# Patient Record
Sex: Female | Born: 1985 | Race: White | Hispanic: No | Smoking: Current every day smoker
Health system: Southern US, Academic
[De-identification: ages and names within clinical notes are randomized; demographics above are authoritative.]

## PROBLEM LIST (undated history)

## (undated) DIAGNOSIS — F329 Major depressive disorder, single episode, unspecified: Secondary | ICD-10-CM

## (undated) DIAGNOSIS — F419 Anxiety disorder, unspecified: Secondary | ICD-10-CM

## (undated) DIAGNOSIS — F1910 Other psychoactive substance abuse, uncomplicated: Secondary | ICD-10-CM

## (undated) DIAGNOSIS — M545 Low back pain, unspecified: Secondary | ICD-10-CM

## (undated) DIAGNOSIS — F609 Personality disorder, unspecified: Secondary | ICD-10-CM

## (undated) DIAGNOSIS — B1920 Unspecified viral hepatitis C without hepatic coma: Secondary | ICD-10-CM

## (undated) DIAGNOSIS — F32A Depression, unspecified: Secondary | ICD-10-CM

## (undated) DIAGNOSIS — G8929 Other chronic pain: Secondary | ICD-10-CM

## (undated) DIAGNOSIS — M1990 Unspecified osteoarthritis, unspecified site: Secondary | ICD-10-CM

## (undated) DIAGNOSIS — F319 Bipolar disorder, unspecified: Secondary | ICD-10-CM

## (undated) DIAGNOSIS — F909 Attention-deficit hyperactivity disorder, unspecified type: Secondary | ICD-10-CM

## (undated) DIAGNOSIS — F952 Tourette's disorder: Secondary | ICD-10-CM

## (undated) DIAGNOSIS — G40909 Epilepsy, unspecified, not intractable, without status epilepticus: Secondary | ICD-10-CM

## (undated) HISTORY — PX: TONSILLECTOMY AND ADENOIDECTOMY: SUR1326

## (undated) HISTORY — DX: Epilepsy, unspecified, not intractable, without status epilepticus (CMS HCC): G40.909

## (undated) HISTORY — DX: Tourette's disorder: F95.2

---

## 2007-08-22 ENCOUNTER — Other Ambulatory Visit (HOSPITAL_COMMUNITY): Payer: Self-pay | Admitting: Emergency Medicine

## 2017-05-24 ENCOUNTER — Emergency Department (HOSPITAL_COMMUNITY)
Admission: EM | Admit: 2017-05-24 | Discharge: 2017-05-25 | Disposition: A | Discharge status: Home / Self Care | Payer: Self-pay | Attending: Emergency Medicine | Admitting: Emergency Medicine

## 2017-05-24 ENCOUNTER — Encounter (HOSPITAL_COMMUNITY): Payer: Self-pay | Admitting: Emergency Medicine

## 2017-05-24 DIAGNOSIS — F17200 Nicotine dependence, unspecified, uncomplicated: Secondary | ICD-10-CM | POA: Insufficient documentation

## 2017-05-24 DIAGNOSIS — Z91040 Latex allergy status: Secondary | ICD-10-CM | POA: Insufficient documentation

## 2017-05-24 DIAGNOSIS — L03115 Cellulitis of right lower limb: Secondary | ICD-10-CM | POA: Insufficient documentation

## 2017-05-24 DIAGNOSIS — R609 Edema, unspecified: Secondary | ICD-10-CM

## 2017-05-24 HISTORY — DX: Unspecified viral hepatitis C without hepatic coma: B19.20

## 2017-05-24 LAB — URINALYSIS, MICROSCOPIC (REFLEX)

## 2017-05-24 LAB — URINALYSIS, ROUTINE W REFLEX MICROSCOPIC
Glucose, UA: NEGATIVE mg/dL
Hgb urine dipstick: NEGATIVE
Ketones, ur: 15 mg/dL — AB
Nitrite: NEGATIVE
Protein, ur: NEGATIVE mg/dL
Specific Gravity, Urine: 1.02 (ref 1.005–1.030)
pH: 6 (ref 5.0–8.0)

## 2017-05-24 LAB — CBC WITH DIFFERENTIAL/PLATELET
Basophils Absolute: 0 10*3/uL (ref 0.0–0.1)
Basophils Relative: 0 %
Eosinophils Absolute: 0.2 10*3/uL (ref 0.0–0.7)
Eosinophils Relative: 2 %
HCT: 36.9 % (ref 36.0–46.0)
Hemoglobin: 11.7 g/dL — ABNORMAL LOW (ref 12.0–15.0)
Lymphocytes Relative: 17 %
Lymphs Abs: 1.9 10*3/uL (ref 0.7–4.0)
MCH: 27.8 pg (ref 26.0–34.0)
MCHC: 31.7 g/dL (ref 30.0–36.0)
MCV: 87.6 fL (ref 78.0–100.0)
Monocytes Absolute: 1.1 10*3/uL — ABNORMAL HIGH (ref 0.1–1.0)
Monocytes Relative: 10 %
Neutro Abs: 7.9 10*3/uL — ABNORMAL HIGH (ref 1.7–7.7)
Neutrophils Relative %: 71 %
Platelets: 218 10*3/uL (ref 150–400)
RBC: 4.21 MIL/uL (ref 3.87–5.11)
RDW: 14.9 % (ref 11.5–15.5)
WBC: 11 10*3/uL — ABNORMAL HIGH (ref 4.0–10.5)

## 2017-05-24 LAB — COMPREHENSIVE METABOLIC PANEL
ALT: 15 U/L (ref 14–54)
ANION GAP: 10 (ref 5–15)
AST: 16 U/L (ref 15–41)
Albumin: 3.9 g/dL (ref 3.5–5.0)
Alkaline Phosphatase: 66 U/L (ref 38–126)
BUN: 13 mg/dL (ref 6–20)
CHLORIDE: 101 mmol/L (ref 101–111)
CO2: 24 mmol/L (ref 22–32)
Calcium: 8.8 mg/dL — ABNORMAL LOW (ref 8.9–10.3)
Creatinine, Ser: 0.9 mg/dL (ref 0.44–1.00)
GFR calc Af Amer: >60 mL/min (ref >60)
GFR calc non Af Amer: >60 mL/min (ref >60)
GLUCOSE: 124 mg/dL — AB (ref 65–99)
POTASSIUM: 3.1 mmol/L — AB (ref 3.5–5.1)
Sodium: 135 mmol/L (ref 135–145)
Total Bilirubin: 0.5 mg/dL (ref 0.3–1.2)
Total Protein: 7.1 g/dL (ref 6.5–8.1)

## 2017-05-24 LAB — POC URINE PREG, ED: Preg Test, Ur: NEGATIVE

## 2017-05-24 NOTE — ED Triage Notes (Addendum)
Pt to ED with c/o bil lower leg swelling and pain.  Feet and lower legs red.  Pt was brought to ED from [facility].    St's they will come back and pick her up with discharged.  [phone]

## 2017-05-25 ENCOUNTER — Inpatient Hospital Stay (HOSPITAL_COMMUNITY)
Admission: EM | Admit: 2017-05-25 | Discharge: 2017-05-27 | DRG: 603 | Disposition: A | Discharge status: Home / Self Care | Payer: Self-pay | Attending: Internal Medicine | Admitting: Internal Medicine

## 2017-05-25 ENCOUNTER — Encounter (HOSPITAL_COMMUNITY): Payer: Self-pay | Admitting: Emergency Medicine

## 2017-05-25 ENCOUNTER — Emergency Department (HOSPITAL_COMMUNITY): Payer: Self-pay

## 2017-05-25 DIAGNOSIS — L03116 Cellulitis of left lower limb: Secondary | ICD-10-CM | POA: Diagnosis present

## 2017-05-25 DIAGNOSIS — Z885 Allergy status to narcotic agent status: Secondary | ICD-10-CM

## 2017-05-25 DIAGNOSIS — L03115 Cellulitis of right lower limb: Principal | ICD-10-CM | POA: Diagnosis present

## 2017-05-25 DIAGNOSIS — Z880 Allergy status to penicillin: Secondary | ICD-10-CM

## 2017-05-25 DIAGNOSIS — B1920 Unspecified viral hepatitis C without hepatic coma: Secondary | ICD-10-CM | POA: Diagnosis present

## 2017-05-25 DIAGNOSIS — F1910 Other psychoactive substance abuse, uncomplicated: Secondary | ICD-10-CM | POA: Diagnosis present

## 2017-05-25 DIAGNOSIS — F17200 Nicotine dependence, unspecified, uncomplicated: Secondary | ICD-10-CM | POA: Diagnosis present

## 2017-05-25 DIAGNOSIS — L0390 Cellulitis, unspecified: Secondary | ICD-10-CM | POA: Diagnosis present

## 2017-05-25 DIAGNOSIS — Z91040 Latex allergy status: Secondary | ICD-10-CM

## 2017-05-25 HISTORY — DX: Low back pain, unspecified: M54.50

## 2017-05-25 HISTORY — DX: Bipolar disorder, unspecified: F31.9

## 2017-05-25 HISTORY — DX: Depression, unspecified: F32.A

## 2017-05-25 HISTORY — DX: Attention-deficit hyperactivity disorder, unspecified type: F90.9

## 2017-05-25 HISTORY — DX: Other chronic pain: G89.29

## 2017-05-25 HISTORY — DX: Anxiety disorder, unspecified: F41.9

## 2017-05-25 HISTORY — DX: Unspecified osteoarthritis, unspecified site: M19.90

## 2017-05-25 HISTORY — DX: Other psychoactive substance abuse, uncomplicated: F19.10

## 2017-05-25 HISTORY — DX: Personality disorder, unspecified: F60.9

## 2017-05-25 HISTORY — DX: Low back pain: M54.5

## 2017-05-25 HISTORY — DX: Major depressive disorder, single episode, unspecified: F32.9

## 2017-05-25 LAB — COMPREHENSIVE METABOLIC PANEL
ALBUMIN: 3.6 g/dL (ref 3.5–5.0)
ALK PHOS: 75 U/L (ref 38–126)
ALT: 16 U/L (ref 14–54)
ANION GAP: 7 (ref 5–15)
AST: 18 U/L (ref 15–41)
BILIRUBIN TOTAL: 0.3 mg/dL (ref 0.3–1.2)
BUN: 16 mg/dL (ref 6–20)
CALCIUM: 9 mg/dL (ref 8.9–10.3)
CO2: 25 mmol/L (ref 22–32)
Chloride: 108 mmol/L (ref 101–111)
Creatinine, Ser: 0.8 mg/dL (ref 0.44–1.00)
GFR calc Af Amer: >60 mL/min (ref >60)
GFR calc non Af Amer: >60 mL/min (ref >60)
GLUCOSE: 96 mg/dL (ref 65–99)
POTASSIUM: 3.7 mmol/L (ref 3.5–5.1)
Sodium: 140 mmol/L (ref 135–145)
TOTAL PROTEIN: 6.2 g/dL — AB (ref 6.5–8.1)

## 2017-05-25 LAB — I-STAT BETA HCG BLOOD, ED (MC, WL, AP ONLY)

## 2017-05-25 LAB — CBC WITH DIFFERENTIAL/PLATELET
BASOS PCT: 0 %
Basophils Absolute: 0 10*3/uL (ref 0.0–0.1)
EOS ABS: 0.1 10*3/uL (ref 0.0–0.7)
Eosinophils Relative: 2 %
HEMATOCRIT: 31.8 % — AB (ref 36.0–46.0)
HEMOGLOBIN: 10.2 g/dL — AB (ref 12.0–15.0)
LYMPHS ABS: 1.3 10*3/uL (ref 0.7–4.0)
Lymphocytes Relative: 18 %
MCH: 28.1 pg (ref 26.0–34.0)
MCHC: 32.1 g/dL (ref 30.0–36.0)
MCV: 87.6 fL (ref 78.0–100.0)
MONO ABS: 0.6 10*3/uL (ref 0.1–1.0)
MONOS PCT: 8 %
NEUTROS ABS: 5.4 10*3/uL (ref 1.7–7.7)
Neutrophils Relative %: 72 %
Platelets: 203 10*3/uL (ref 150–400)
RBC: 3.63 MIL/uL — ABNORMAL LOW (ref 3.87–5.11)
RDW: 15 % (ref 11.5–15.5)
WBC: 7.4 10*3/uL (ref 4.0–10.5)

## 2017-05-25 LAB — RAPID HIV SCREEN (HIV 1/2 AB+AG)
HIV 1/2 Antibodies: NONREACTIVE
HIV-1 P24 ANTIGEN - HIV24: NONREACTIVE

## 2017-05-25 LAB — I-STAT CG4 LACTIC ACID, ED: LACTIC ACID, VENOUS: 0.88 mmol/L (ref 0.5–1.9)

## 2017-05-25 MED ORDER — DEXTROSE 5 % IV SOLN
2.0000 g | Freq: Once | INTRAVENOUS | Status: DC
  Filled 2017-05-25: qty 2

## 2017-05-25 MED ORDER — AZTREONAM 2 G IJ SOLR
2.0000 g | Freq: Three times a day (TID) | INTRAMUSCULAR | Status: DC
  Filled 2017-05-25: qty 2

## 2017-05-25 MED ORDER — DOXYCYCLINE HYCLATE 100 MG PO CAPS: 100.0000 mg | ORAL_CAPSULE | Freq: Two times a day (BID) | ORAL | 0 refills | Status: DC

## 2017-05-25 MED ORDER — IBUPROFEN 800 MG PO TABS
800.0000 mg | ORAL_TABLET | Freq: Once | ORAL | Status: AC
  Administered 2017-05-25: 800 mg via ORAL
  Filled 2017-05-25: qty 1

## 2017-05-25 MED ORDER — METRONIDAZOLE IN NACL 5-0.79 MG/ML-% IV SOLN: 500.0000 mg | Freq: Three times a day (TID) | INTRAVENOUS | Status: DC

## 2017-05-25 MED ORDER — VANCOMYCIN HCL IN DEXTROSE 1-5 GM/200ML-% IV SOLN
1000.0000 mg | Freq: Once | INTRAVENOUS | Status: AC
  Administered 2017-05-25: 1000 mg via INTRAVENOUS
  Filled 2017-05-25: qty 200

## 2017-05-25 MED ORDER — VANCOMYCIN HCL IN DEXTROSE 750-5 MG/150ML-% IV SOLN
750.0000 mg | Freq: Two times a day (BID) | INTRAVENOUS | Status: DC
  Administered 2017-05-26 – 2017-05-27 (×3): 750 mg via INTRAVENOUS
  Filled 2017-05-25 (×4): qty 150

## 2017-05-25 MED ORDER — DOXYCYCLINE HYCLATE 100 MG PO TABS
100.0000 mg | ORAL_TABLET | Freq: Once | ORAL | Status: AC
  Administered 2017-05-25: 100 mg via ORAL
  Filled 2017-05-25: qty 1

## 2017-05-25 MED ORDER — METRONIDAZOLE IN NACL 5-0.79 MG/ML-% IV SOLN
500.0000 mg | Freq: Once | INTRAVENOUS | Status: DC
  Filled 2017-05-25: qty 100

## 2017-05-25 NOTE — ED Notes (Signed)
Pt called with no answer

## 2017-05-25 NOTE — ED Triage Notes (Signed)
Pt with complaints of bilateral lower leg swelling and pain that extends to the knees. Bilateral legs are red and swollen. Pt reports toenails have fallen off due to being so swollen.

## 2017-05-25 NOTE — ED Notes (Signed)
Patient sleeping with no distress/respirations unlabored .  

## 2017-05-25 NOTE — ED Provider Notes (Signed)
[facility] DEPT Provider Note   CSN: [id number] Arrival date & time: 05/25/17  1732     History   Chief Complaint Chief Complaint  Patient presents with  . Foot Pain    HPI Shannon Munoz is a 31 y.o. female.  31 y.o      31 year old female history of hep C and polysubstance abuse presents today complaining of bilateral foot pain, swelling, redness. She endorses fever and chills. She endorses some chest pain and dyspnea. She states that she has been in jail until 5 days ago. She denies any substance abuse in the prior 90 days. Review of records from here and [facility] revealed that she was evaluated yesterday at [facility]. She had Dopplers of bilateral lower extremities done that showed no evidence of DVT. She was positive foramphetamines, benzodiazepines, cocaine, and cannabinoids.  Past Medical History:  Diagnosis Date  . Hepatitis C     There are no active problems to display for this patient.   Past Surgical History:  Procedure Laterality Date  . TONSILLECTOMY      OB History    No data available       Home Medications    Prior to Admission medications   Medication Sig Start Date End Date Taking? Authorizing Provider  doxycycline (VIBRAMYCIN) 100 MG capsule Take 1 capsule (100 mg total) by mouth 2 (two) times daily. One po bid x 7 days 05/25/17   Zadie Rhine, MD    Family History No family history on file.  Social History Social History  Substance Use Topics  . Smoking status: Current Every Day Smoker  . Smokeless tobacco: Never Used  . Alcohol use No     Allergies   Darvon [propoxyphene]; Penicillins; and Latex   Review of Systems Review of Systems  Constitutional: Positive for chills, fatigue and fever.  Cardiovascular: Positive for chest pain.  Skin: Positive for color change, rash and wound.  All other systems reviewed and are negative.    Physical Exam Updated Vital Signs BP 112/72 (BP Location: Right Arm)   Pulse 87   Temp 97.6 F  (36.4 C) (Oral)   Resp 18   Ht 1.651 m ( )   Wt 59 kg (130 lb)   SpO2 100%   BMI 21.63 kg/m   Physical Exam  Constitutional: She appears well-developed and well-nourished.  HENT:  Head: Normocephalic and atraumatic.  Right Ear: External ear normal.  Eyes: Pupils are equal, round, and reactive to light.  Neck: Normal range of motion. Neck supple.  Cardiovascular: Normal rate and regular rhythm.   Pulmonary/Chest: Effort normal and breath sounds normal.  Abdominal: Soft. Bowel sounds are normal.  Musculoskeletal: She exhibits edema.  bilateralpedal edema with erythema creeping up right leg. Bilateral great toenails have discoloration below them. Dorsal pedalis pulses are intact  Neurological: She is alert.  Skin: Skin is warm. Capillary refill takes less than 2 seconds.  Psychiatric: Her mood appears anxious. Her affect is angry, labile and inappropriate. Her speech is rapid and/or pressured. She is agitated. Cognition and memory are normal.  Nursing note and vitals reviewed.    ED Treatments / Results  Labs (all labs ordered are listed, but only abnormal results are displayed) Labs Reviewed  CULTURE, BLOOD (ROUTINE X 2)  CULTURE, BLOOD (ROUTINE X 2)  CBC WITH DIFFERENTIAL/PLATELET  COMPREHENSIVE METABOLIC PANEL  I-STAT CG4 LACTIC ACID, ED    EKG  EKG Interpretation  Date/Time:  Wednesday May 25 2017 22:54:30 EDT Ventricular Rate:  78  PR Interval:  132 QRS Duration: 70 QT Interval:  386 QTC Calculation: 440 R Axis:   83 Text Interpretation:  Normal sinus rhythm Normal ECG Confirmed by Margarita Grizzle [phone]) on 05/25/2017 11:01:30 PM       Radiology Dg Chest 2 View  Result Date: 05/25/2017 CLINICAL DATA:  31 year old female with a history of drug abuse. EXAM: CHEST  2 VIEW COMPARISON:  None. FINDINGS: The heart size and mediastinal contours are within normal limits. Both lungs are clear. The visualized skeletal structures are unremarkable. IMPRESSION: No  active cardiopulmonary disease. Electronically Signed   By: Gilmer Mor D.O.   On: 05/25/2017 21:23    Procedures Procedures (including critical care time)  Medications Ordered in ED Medications - No data to display   Initial Impression / Assessment and Plan / ED Course  I have reviewed the triage vital signs and the nursing notes.  Pertinent labs & imaging results that were available during my care of the patient were reviewed by me and considered in my medical decision making (see chart for details).     Patient with bilateral lower extremity swelling redness and subungal hematoma/hemmorhage.  Record reviews and pe c.w. Substance abuse and ivda.  Concern for endocarditis vs cellulitis- patient given iv abx pending work up with cardiac imaging.  Discussed with Dr. Onalee Hua and she will admit Final Clinical Impressions(s) / ED Diagnoses   Final diagnoses:  Hepatitis C virus infection without hepatic coma, unspecified chronicity    New Prescriptions New Prescriptions   No medications on file     Margarita Grizzle, MD 05/26/17 1052

## 2017-05-25 NOTE — ED Notes (Signed)
Staff at [facility] advised nurse that they will pick up the patient at 8 am this morning .

## 2017-05-25 NOTE — ED Notes (Addendum)
Message left at answering machine of [facility] [phone] pt.'s discharge .

## 2017-05-25 NOTE — ED Provider Notes (Signed)
[facility] Provider Note   CSN: [id number] Arrival date & time: 05/24/17  2012     History   Chief Complaint Chief Complaint  Patient presents with  . Leg Swelling    HPI Shannon Munoz is a 31 y.o. female.  The history is provided by the patient.  Patient reports onset of bilateral LE swelling/redness over past day No fever/vomiting No cp/sob Her course is worsening Nothing improves her symptoms She has never had this before She reports h/o hepatitis C She only takes klonopin She recently moved her for "rehab" at [facility] She had been in jail prior to moving here She reports she used to use meth, and did use IV drugs, but denies injecting in legs/feet No h/o VTE She reports walking bare foot recently   Past Medical History:  Diagnosis Date  . Hepatitis C     There are no active problems to display for this patient.   Past Surgical History:  Procedure Laterality Date  . TONSILLECTOMY      OB History    No data available       Home Medications    Prior to Admission medications   Not on File    Family History No family history on file.  Social History Social History  Substance Use Topics  . Smoking status: Current Every Day Smoker  . Smokeless tobacco: Never Used  . Alcohol use No     Allergies   Darvon [propoxyphene]; Penicillins; and Latex   Review of Systems Review of Systems  Constitutional: Negative for fever.  Respiratory: Negative for shortness of breath.   Cardiovascular: Negative for chest pain.  Gastrointestinal: Negative for vomiting.  Musculoskeletal: Positive for joint swelling.  Skin: Positive for color change and wound.  All other systems reviewed and are negative.    Physical Exam Updated Vital Signs BP (!) 92/58 (BP Location: Right Arm)   Pulse 91   Temp 98.3 F (36.8 C) (Oral)   Resp 16   Ht 1.651 m ( )   Wt 59 kg (130 lb)   SpO2 100%   BMI 21.63 kg/m   Physical Exam CONSTITUTIONAL:  sleeping on arrival but easily arousable, no distress.  disheveled HEAD: Normocephalic/atraumatic EYES: EOMI/PERRL ENMT: Mucous membranes moist NECK: supple no meningeal signs SPINE/BACK:entire spine nontender CV: S1/S2 noted, no murmurs/rubs/gallops noted LUNGS: Lungs are clear to auscultation bilaterally, no apparent distress ABDOMEN: soft, nontender  NEURO: Pt is awake/alert/appropriate, moves all extremitiesx4.  No facial droop.   EXTREMITIES: pulses normal/equal, full ROM, see photos No crepitus.  No puncture wounds.  She can range both ankles.  No calf tenderness.   SKIN: warm, color normal PSYCH: no abnormalities of mood noted, alert and oriented to situation   Patient gave verbal permission to utilize photo for medical documentation only The image was not stored on any personal device   ED Treatments / Results  Labs (all labs ordered are listed, but only abnormal results are displayed) Labs Reviewed  CBC WITH DIFFERENTIAL/PLATELET - Abnormal; Notable for the following:       Result Value   WBC 11.0 (*)    Hemoglobin 11.7 (*)    Neutro Abs 7.9 (*)    Monocytes Absolute 1.1 (*)    All other components within normal limits  COMPREHENSIVE METABOLIC PANEL - Abnormal; Notable for the following:    Potassium 3.1 (*)    Glucose, Bld 124 (*)    Calcium 8.8 (*)    All other components within  normal limits  URINALYSIS, ROUTINE W REFLEX MICROSCOPIC - Abnormal; Notable for the following:    Bilirubin Urine SMALL (*)    Ketones, ur 15 (*)    Leukocytes, UA SMALL (*)    All other components within normal limits  URINALYSIS, MICROSCOPIC (REFLEX) - Abnormal; Notable for the following:    Bacteria, UA RARE (*)    Squamous Epithelial / LPF 0-5 (*)    All other components within normal limits  POC URINE PREG, ED    EKG  EKG Interpretation None       Radiology No results found.  Procedures Procedures (including critical care time)  Medications Ordered in  ED Medications  doxycycline (VIBRA-TABS) tablet 100 mg (100 mg Oral Given 05/25/17 0132)     Initial Impression / Assessment and Plan / ED Course  I have reviewed the triage vital signs and the nursing notes.  Pertinent labs  results that were available during my care of the patient were reviewed by me and considered in my medical decision making (see chart for details).     Pt stable Not septic appearing No distress Suspect this may be cellulitis superimposed on peripheral edema as she has been walking barefoot, just got out of jail Doubt DVT No signs of CHF No signs of septic joint No abscess noted Will d/c home on doxycycline Advised rest and elevation of legs   Final Clinical Impressions(s) / ED Diagnoses   Final diagnoses:  Peripheral edema  Cellulitis of right lower extremity    New Prescriptions New Prescriptions   DOXYCYCLINE (VIBRAMYCIN) 100 MG CAPSULE    Take 1 capsule (100 mg total) by mouth 2 (two) times daily. One po bid x 7 days     Zadie Rhine, MD 05/25/17 [phone]

## 2017-05-25 NOTE — Progress Notes (Signed)
Pharmacy Antibiotic Note  Marijane Trower is a 31 y.o. female admitted on 05/25/2017 with cellulitis.  Pharmacy has been consulted for vancomycin dosing.  Plan: Vancomycin 1000 IV once then   every 12 hours.  Goal trough 15-20 mcg/mL.  Height:  (165.1 cm) Weight: 130 lb (59 kg) IBW/kg (Calculated) : 57  Temp (24hrs), Avg:98 F (36.7 C), Min:97.6 F (36.4 C), Max:98.3 F (36.8 C)   Recent Labs Lab 05/24/17 2041 05/25/17 2138 05/25/17 2155  WBC 11.0* 7.4  --   CREATININE 0.90 0.80  --   LATICACIDVEN  --   --  0.88    Estimated Creatinine Clearance: 92.5 mL/min (by C-G formula based on SCr of 0.8 mg/dL).    Allergies  Allergen Reactions  . Darvon [Propoxyphene] Anaphylaxis  . Penicillins Anaphylaxis  . Latex Rash    Thank you for allowing pharmacy to be a part of this patient's care.  Toniann Fail Jian Hodgman 05/25/2017 11:01 PM

## 2017-05-25 NOTE — ED Notes (Signed)
Pt discharged, waiting for ride.  Pt asleep, resp e/u,  NAD noted at this time.

## 2017-05-26 ENCOUNTER — Encounter (HOSPITAL_COMMUNITY): Payer: Self-pay | Admitting: General Practice

## 2017-05-26 DIAGNOSIS — F1910 Other psychoactive substance abuse, uncomplicated: Secondary | ICD-10-CM

## 2017-05-26 DIAGNOSIS — L03119 Cellulitis of unspecified part of limb: Secondary | ICD-10-CM

## 2017-05-26 DIAGNOSIS — B1920 Unspecified viral hepatitis C without hepatic coma: Secondary | ICD-10-CM

## 2017-05-26 LAB — BASIC METABOLIC PANEL
ANION GAP: 5 (ref 5–15)
BUN: 14 mg/dL (ref 6–20)
CO2: 24 mmol/L (ref 22–32)
Calcium: 8.5 mg/dL — ABNORMAL LOW (ref 8.9–10.3)
Chloride: 113 mmol/L — ABNORMAL HIGH (ref 101–111)
Creatinine, Ser: 0.61 mg/dL (ref 0.44–1.00)
GFR calc Af Amer: >60 mL/min (ref >60)
GLUCOSE: 92 mg/dL (ref 65–99)
POTASSIUM: 4.1 mmol/L (ref 3.5–5.1)
Sodium: 142 mmol/L (ref 135–145)

## 2017-05-26 LAB — CBC
HEMATOCRIT: 32.8 % — AB (ref 36.0–46.0)
HEMOGLOBIN: 10.4 g/dL — AB (ref 12.0–15.0)
MCH: 28 pg (ref 26.0–34.0)
MCHC: 31.7 g/dL (ref 30.0–36.0)
MCV: 88.4 fL (ref 78.0–100.0)
Platelets: 198 10*3/uL (ref 150–400)
RBC: 3.71 MIL/uL — ABNORMAL LOW (ref 3.87–5.11)
RDW: 15.4 % (ref 11.5–15.5)
WBC: 4.7 10*3/uL (ref 4.0–10.5)

## 2017-05-26 MED ORDER — SODIUM CHLORIDE 0.9 % IV SOLN
INTRAVENOUS | Status: DC
  Administered 2017-05-26 – 2017-05-27 (×3): via INTRAVENOUS

## 2017-05-26 MED ORDER — IBUPROFEN 800 MG PO TABS
800.0000 mg | ORAL_TABLET | Freq: Four times a day (QID) | ORAL | Status: DC | PRN
  Administered 2017-05-26 – 2017-05-27 (×3): 800 mg via ORAL
  Filled 2017-05-26 (×3): qty 1

## 2017-05-26 MED ORDER — ONDANSETRON HCL 4 MG PO TABS: 4.0000 mg | ORAL_TABLET | Freq: Four times a day (QID) | ORAL | Status: DC | PRN

## 2017-05-26 MED ORDER — ACETAMINOPHEN 650 MG RE SUPP: 650.0000 mg | Freq: Four times a day (QID) | RECTAL | Status: DC | PRN

## 2017-05-26 MED ORDER — PNEUMOCOCCAL VAC POLYVALENT 25 MCG/0.5ML IJ INJ: 0.5000 mL | INJECTION | INTRAMUSCULAR | Status: DC

## 2017-05-26 MED ORDER — ENOXAPARIN SODIUM 40 MG/0.4ML ~~LOC~~ SOLN
40.0000 mg | SUBCUTANEOUS | Status: DC
  Administered 2017-05-26: 40 mg via SUBCUTANEOUS
  Filled 2017-05-26: qty 0.4

## 2017-05-26 MED ORDER — SODIUM CHLORIDE 0.9 % IV BOLUS (SEPSIS)
1000.0000 mL | Freq: Once | INTRAVENOUS | Status: AC
  Administered 2017-05-26: 1000 mL via INTRAVENOUS

## 2017-05-26 MED ORDER — ACETAMINOPHEN 325 MG PO TABS
650.0000 mg | ORAL_TABLET | Freq: Four times a day (QID) | ORAL | Status: DC | PRN
  Administered 2017-05-26 – 2017-05-27 (×3): 650 mg via ORAL
  Filled 2017-05-26 (×3): qty 2

## 2017-05-26 MED ORDER — INFLUENZA VAC SPLIT QUAD 0.5 ML IM SUSY: 0.5000 mL | PREFILLED_SYRINGE | INTRAMUSCULAR | Status: DC

## 2017-05-26 MED ORDER — ONDANSETRON HCL 4 MG/2ML IJ SOLN: 4.0000 mg | Freq: Four times a day (QID) | INTRAMUSCULAR | Status: DC | PRN

## 2017-05-26 NOTE — H&P (Signed)
History and Physical    Shannon Munoz ZOX:[id number] DOB: 10-06-1985 DOA: 05/25/2017  PCP: Patient, No Pcp Per  Patient coming from:  home  Chief Complaint:   Leg swelling and red  HPI: Shannon Munoz is a 31 y.o. female with medical history significant of IVDA, hep c comes in with both feet swollen and redness starting up to above ankles.  Pt is not cooperative at all with answering questions.  She is not happy because multiple staff members in the ED (she says 15 doctors) have come in and asked her questions and she is tired of answering questions.  She denies any ivda or any illicit drug use.  She says she just got out of jail and she went to [facility] yesterday and had a grossly positive UDS for amphetamines, benzos, cocaine and THC.  She says this is a lie and inaccurate and was not her urine.  When offered to repeat the uds she did not wish to do so.  She denies any fevers.  She will not tell me how long her legs having been swelling or turning red.  She was given doxy yesterday to take which she is not taking.  She denies shooting any needles to her feet.  She is requesting stronger pain iv medications.  Pt referred for admission for cellulitis.  Review of Systems: As per HPI otherwise 10 point review of systems negative.   Past Medical History:  Diagnosis Date  . Hepatitis C     Past Surgical History:  Procedure Laterality Date  . TONSILLECTOMY       reports that she has been smoking.  She has never used smokeless tobacco. She reports that she uses drugs. She reports that she does not drink alcohol.  Allergies  Allergen Reactions  . Darvon [Propoxyphene] Anaphylaxis  . Penicillins Anaphylaxis  . Latex Rash    No family history on file. no premature CAD  Prior to Admission medications   Medication Sig Start Date End Date Taking? Authorizing Provider  doxycycline (VIBRAMYCIN) 100 MG capsule Take 1 capsule (100 mg total) by mouth 2 (two) times daily. One po bid x 7 days Patient  not taking: Reported on 05/26/2017 05/25/17   Zadie Rhine, MD    Physical Exam: Vitals:   05/25/17 2247 05/25/17 2301 05/25/17 2316 05/25/17 2345  BP: 98/67 (!)  Pulse: 81     Resp: (!) (!) 26  Temp:      TempSrc:      SpO2: 98%     Weight:      Height:          Constitutional: NAD, calm, comfortable Vitals:   05/25/17 2247 05/25/17 2301 05/25/17 2316 05/25/17 2345  BP: 98/67 (!)  Pulse: 81     Resp: (!) (!) 26  Temp:      TempSrc:      SpO2: 98%     Weight:      Height:       Eyes: PERRL, lids and conjunctivae normal ENMT: Mucous membranes are moist. Posterior pharynx clear of any exudate or lesions.Normal dentition.  Neck: normal, supple, no masses, no thyromegaly Respiratory: clear to auscultation bilaterally, no wheezing, no crackles. Normal respiratory effort. No accessory muscle use.  Cardiovascular: Regular rate and rhythm, no murmurs / rubs / gallops. No extremity edema. 2+ pedal pulses. No carotid bruits.  Abdomen: no tenderness, no masses palpated. No hepatosplenomegaly. Bowel sounds positive.  Musculoskeletal: no clubbing / cyanosis. No joint deformity upper and lower extremities. Good ROM, no contractures. Normal muscle tone.  Skin: mild erythema and swelling to bilateral feet up to past ankles c/w cellulitis  Bilateral subungual hematomas, no evidence of septic emboli to hands and feet Neurologic: CN 2-12 grossly intact. Sensation intact, DTR normal. Strength 5/5 in all 4.  Psychiatric: uncooperative, agitated.  Normal mental status. Poor insight.  Labs on Admission: I have personally reviewed following labs and imaging studies  CBC:  Recent Labs Lab 05/24/17 2041 05/25/17 2138  WBC 11.0* 7.4  NEUTROABS 7.9* 5.4  HGB 11.7* 10.2*  HCT 36.9 31.8*  MCV 87.6 87.6  PLT 218 203   Basic Metabolic Panel:  Recent Labs Lab 05/24/17 2041 05/25/17 2138  NA 135 140  K 3.1* 3.7  CL 101 108  CO2  24 25  GLUCOSE 124* 96  BUN 13 16  CREATININE 0.90 0.80  CALCIUM 8.8* 9.0   GFR: Estimated Creatinine Clearance: 92.5 mL/min (by C-G formula based on SCr of 0.8 mg/dL). Liver Function Tests:  Recent Labs Lab 05/24/17 2041 05/25/17 2138  AST 16 18  ALT 15 16  ALKPHOS 66 75  BILITOT 0.5 0.3  PROT 7.1 6.2*  ALBUMIN 3.9 3.6   Radiological Exams on Admission: Dg Chest 2 View  Result Date: 05/25/2017 CLINICAL DATA:  31 year old female with a history of drug abuse. EXAM: CHEST  2 VIEW COMPARISON:  None. FINDINGS: The heart size and mediastinal contours are within normal limits. Both lungs are clear. The visualized skeletal structures are unremarkable. IMPRESSION: No active cardiopulmonary disease. Electronically Signed   By: Gilmer Mor D.O.   On: 05/25/2017 21:23     Assessment/Plan 31 yo female IVDA with bilateral lower ext cellulitis  Principal Problem:   Cellulitis- iv vanc.  Follow cultures.  Afebrile here.  If continues to spike fever consider endocarditis which she is high risk for.  Active Problems:   IV drug abuse [facility])- she adamantly denies this despite her uds from yesterday.  Will not repeat.   Hepatitis C- noted    DVT prophylaxis:  ambulate Code Status:  full Family Communication:  none  Disposition Plan:  Per day team Consults called:  none Admission status:  admit   Rahima Fleishman A MD [facility]  If 7PM-7AM, please contact night-coverage [email or web address] Password [email or web address]  05/26/2017, 12:13 AM

## 2017-05-26 NOTE — Progress Notes (Signed)
PROGRESS NOTE    Shannon Munoz  ZOX:[id number] DOB: Apr 08, 1986 DOA: 05/25/2017 PCP: Patient, No Pcp Per    Brief Narrative:  Shannon Munoz is a 31 y.o. female with medical history significant of IVDA, hep c comes in with both feet swollen and redness starting up to above ankles.  Pt is not cooperative at all with answering questions.  She is not happy because multiple staff members in the ED (she says 15 doctors) have come in and asked her questions and she is tired of answering questions.  She denies any ivda or any illicit drug use.  She says she just got out of jail and she went to [facility] yesterday and had a grossly positive UDS for amphetamines, benzos, cocaine and THC.  She says this is a lie and inaccurate and was not her urine.  When offered to repeat the uds she did not wish to do so.  She denies any fevers.  She will not tell me how long her legs having been swelling or turning red.  She was given doxy yesterday to take which she is not taking.  She denies shooting any needles to her feet.  She is requesting stronger pain iv medications.  Pt referred for admission for cellulitis  Assessment & Plan:   Principal Problem:   Cellulitis Active Problems:   IV drug abuse (HCC)   Hepatitis C   1-Lower extremities cellulitis; redness improved. Still with significant amount of pain.  Ibuprofen for pain.  IV vancomycin.    DVT prophylaxis: lovenox Code Status: full code.  Family Communication:  Disposition Plan: home in 24 hours.    Consultants:   none   Procedures: none   Antimicrobials;  Vancomycin    Subjective: Complaint of lower extremities pain. Redness improved.   Objective: Vitals:   05/26/17 0045 05/26/17 0127 05/26/17 0519 05/26/17 1431  BP: 109/63  Pulse:  74 60 77  Resp: 19 (!) 21  (!) 21  Temp:  97.7 F (36.5 C) 97.9 F (36.6 C) 97.8 F (36.6 C)  TempSrc:  Oral Oral Oral  SpO2:  99% 100% 100%  Weight:      Height:         Intake/Output Summary (Last 24 hours) at 05/26/17 1533 Last data filed at 05/26/17 0639  Gross per 24 hour  Intake           718.75 ml  Output                0 ml  Net           718.75 ml   Filed Weights   05/25/17 1821  Weight: 59 kg (130 lb)    Examination:  General exam: Appears calm and comfortable  Respiratory system: Clear to auscultation. Respiratory effort normal. Cardiovascular system: S1 & S2 heard, RRR. No JVD, murmurs, rubs, gallops or clicks. No pedal edema. Gastrointestinal system: Abdomen is nondistended, soft and nontender. No organomegaly or masses felt. Normal bowel sounds heard. Central nervous system: Alert and oriented. No focal neurological deficits. Extremities: Symmetric 5 x 5 power. Lower extremities with mild redness , no edema.  Skin: No rashes, lesions or ulcers   Data Reviewed: I have personally reviewed following labs and imaging studies  CBC:  Recent Labs Lab 05/24/17 2041 05/25/17 2138 05/26/17 0912  WBC 11.0* 7.4 4.7  NEUTROABS 7.9* 5.4  --   HGB 11.7* 10.2* 10.4*  HCT 36.9 31.8* 32.8*  MCV 87.6 87.6 88.4  PLT 218 203 198   Basic Metabolic Panel:  Recent Labs Lab 05/24/17 2041 05/25/17 2138 05/26/17 0912  NA 135 140 142  K 3.1* 3.7 4.1  CL 101 108 113*  CO2 GLUCOSE 124* 96 92  BUN CREATININE 0.90 0.80 0.61  CALCIUM 8.8* 9.0 8.5*   GFR: Estimated Creatinine Clearance: 92.5 mL/min (by C-G formula based on SCr of 0.61 mg/dL). Liver Function Tests:  Recent Labs Lab 05/24/17 2041 05/25/17 2138  AST 16 18  ALT 15 16  ALKPHOS 66 75  BILITOT 0.5 0.3  PROT 7.1 6.2*  ALBUMIN 3.9 3.6   No results for input(s): LIPASE, AMYLASE in the last 168 hours. No results for input(s): AMMONIA in the last 168 hours. Coagulation Profile: No results for input(s): INR, PROTIME in the last 168 hours. Cardiac Enzymes: No results for input(s): CKTOTAL, CKMB, CKMBINDEX, TROPONINI in the last 168 hours. BNP (last 3  results) No results for input(s): PROBNP in the last 8760 hours. HbA1C: No results for input(s): HGBA1C in the last 72 hours. CBG: No results for input(s): GLUCAP in the last 168 hours. Lipid Profile: No results for input(s): CHOL, HDL, LDLCALC, TRIG, CHOLHDL, LDLDIRECT in the last 72 hours. Thyroid Function Tests: No results for input(s): TSH, T4TOTAL, FREET4, T3FREE, THYROIDAB in the last 72 hours. Anemia Panel: No results for input(s): VITAMINB12, FOLATE, FERRITIN, TIBC, IRON, RETICCTPCT in the last 72 hours. Sepsis Labs:  Recent Labs Lab 05/25/17 2155  LATICACIDVEN 0.88    Recent Results (from the past 240 hour(s))  Blood culture (routine x 2)     Status: None (Preliminary result)   Collection Time: 05/25/17 11:36 PM  Result Value Ref Range Status   Specimen Description BLOOD BLOOD RIGHT FOREARM  Final   Special Requests   Final    BOTTLES DRAWN AEROBIC AND ANAEROBIC Blood Culture adequate volume   Culture NO GROWTH < 24 HOURS  Final   Report Status PENDING  Incomplete  Blood culture (routine x 2)     Status: None (Preliminary result)   Collection Time: 05/25/17 11:37 PM  Result Value Ref Range Status   Specimen Description BLOOD RIGHT WRIST  Final   Special Requests   Final    BOTTLES DRAWN AEROBIC AND ANAEROBIC Blood Culture adequate volume   Culture NO GROWTH < 24 HOURS  Final   Report Status PENDING  Incomplete         Radiology Studies: Dg Chest 2 View  Result Date: 05/25/2017 CLINICAL DATA:  31 year old female with a history of drug abuse. EXAM: CHEST  2 VIEW COMPARISON:  None. FINDINGS: The heart size and mediastinal contours are within normal limits. Both lungs are clear. The visualized skeletal structures are unremarkable. IMPRESSION: No active cardiopulmonary disease. Electronically Signed   By: Gilmer Mor D.O.   On: 05/25/2017 21:23        Scheduled Meds: Continuous Infusions: . sodium chloride 75 mL/hr at 05/26/17 1152  . vancomycin Stopped  (05/26/17 0916)     LOS: 1 day    Time spent: 35 minutes.     Alba Cory, MD [facility] Pager [phone]  If 7PM-7AM, please contact night-coverage [email or web address] Password [facility] [place] [place] 05/26/2017, 3:33 PM

## 2017-05-27 MED ORDER — IBUPROFEN 800 MG PO TABS: 800.0000 mg | ORAL_TABLET | Freq: Four times a day (QID) | ORAL | 0 refills | Status: DC | PRN

## 2017-05-27 MED ORDER — DOXYCYCLINE HYCLATE 100 MG PO CAPS: 100.0000 mg | ORAL_CAPSULE | Freq: Two times a day (BID) | ORAL | 0 refills | Status: DC

## 2017-05-27 MED ORDER — ACETAMINOPHEN 325 MG PO TABS: 650.0000 mg | ORAL_TABLET | Freq: Four times a day (QID) | ORAL | 0 refills | Status: DC | PRN

## 2017-05-27 NOTE — Progress Notes (Addendum)
Pt has a discharge order for today. I tried to call her friend Shannon Munoz since this morning but no answer. 1500 Able to get hold of Shannon Munoz but he is not able to give transportation for the pt since he lives in [place], [place]. Referred pt to the social worker Shannon Munoz. A list of substance abuse rehab facility was given to pt. Discharge instructions was given to pt, discharged.

## 2017-05-27 NOTE — Care Management Note (Signed)
Case Management Note  Patient Details  Name: Shannon Munoz MRN: [id number] Date of Birth: January 14, 1986  Subjective/Objective:                    Action/Plan:  MATCH letter given and explained. Patient very drowsy but voiced understanding. Expected Discharge Date:  05/27/17               Expected Discharge Plan:  Home/Self Care  In-House Referral:     Discharge planning Services  CM Consult, MATCH Program, Medication Assistance  Post Acute Care Choice:    Choice offered to:  Patient  DME Arranged:    DME Agency:     HH Arranged:    HH Agency:     Status of Service:  Completed, signed off  If discussed at [employer], dates discussed:    Additional Comments:  Kingsley Plan, RN 05/27/2017, 12:43 PM

## 2017-05-27 NOTE — Progress Notes (Signed)
This was a pastoral care consult visit. Patient was a Shannon Munoz. No family on-site. Chaplain Cathryn Gallery consulted with the charge nurse and promised to revixsit when patient is awake and alert.   Kary Kos.

## 2017-05-27 NOTE — Social Work (Signed)
CSW met with patient as nurse indicated that patient need assistance home. CSW f/u with patient and she needs transport to [place]. CSW advised that CSW can only provide transport to local [place] area and she would need to f/u with family to assist.  CSW was advised that patient trying to get into [facility] treatment program [phone]  in [place]. CSW found a number on line and called and was unsuccessful at making contact with admission/intake staff. CSW provided patient with other inpatient rehab resources in the local area. Pt indicated she would call and went back to sleep. CSW attempted to engage and was unsuccessful.  CSW will sign off for now as social work intervention is no longer needed. Please consult [place] again if new need arises.  Elissa Hefty, LCSW Clinical Social Worker [phone]

## 2017-05-27 NOTE — Discharge Summary (Signed)
Physician Discharge Summary  Shannon Munoz JXB:[id number] DOB: 02/09/1986 DOA: 05/25/2017  PCP: Norval Morton, MD  Admit date: 05/25/2017 Discharge date: 05/27/2017  Admitted From: home  Disposition: home   Recommendations for Outpatient Follow-up:  1. Follow up with PCP in 1-2 weeks 2. Please obtain BMP/CBC in one week 3. Please follow up on the following pending results: final results of blood culture.     Discharge Condition: Stable.  CODE STATUS: full code.  Diet recommendation: Heart Healthy   Brief/Interim Summary: Shannon Goodmanis a 31 y.o.femalewith medical history significant of IVDA, hep c comes in with both feet swollen and redness starting up to above ankles. Pt is not cooperative at all with answering questions. She is not happy because multiple staff members in the ED (she says 15 doctors) have come in and asked her questions and she is tired of answering questions. She denies any ivda or any illicit drug use. She says she just got out of jail and she went to [facility] yesterday and had a grossly positive UDS for amphetamines, benzos, cocaine and THC. She says this is a lie and inaccurate and was not her urine. When offered to repeat the uds she did not wish to do so. She denies any fevers. She will not tell me how long her legs having been swelling or turning red. She was given doxy yesterday to take which she is not taking. She denies shooting any needles to her feet. She is requesting stronger pain iv medications. Pt referred for admission for cellulitis  Assessment & Plan:   Principal Problem:   Cellulitis Active Problems:   IV drug abuse (HCC)   Hepatitis C   1-Lower extremities cellulitis; redness improved. Still with significant amount of pain.  Ibuprofen for pain.  Treated with IV vancomycin. Redness edema improved. Plan to discharge on doxycycline for 7 days.  Blood culture negative for 24 hours.   Discharge Diagnoses:  Principal  Problem:   Cellulitis Active Problems:   IV drug abuse (HCC)   Hepatitis C    Discharge Instructions  Discharge Instructions    Diet - low sodium heart healthy    Complete by:  As directed    Increase activity slowly    Complete by:  As directed      Allergies as of 05/27/2017      Reactions   Darvon [propoxyphene] Anaphylaxis   Penicillins Anaphylaxis   Latex Rash      Medication List    TAKE these medications   acetaminophen 325 MG tablet Commonly known as:  TYLENOL Take 2 tablets (650 mg total) by mouth every 6 (six) hours as needed for mild pain (or Fever >/= 101).   doxycycline 100 MG capsule Commonly known as:  VIBRAMYCIN Take 1 capsule (100 mg total) by mouth 2 (two) times daily. One po bid x 7 days   ibuprofen 800 MG tablet Commonly known as:  ADVIL,MOTRIN Take 1 tablet (800 mg total) by mouth every 6 (six) hours as needed for moderate pain.       Allergies  Allergen Reactions  . Darvon [Propoxyphene] Anaphylaxis  . Penicillins Anaphylaxis  . Latex Rash    Consultations:  none   Procedures/Studies: Dg Chest 2 View  Result Date: 05/25/2017 CLINICAL DATA:  31 year old female with a history of drug abuse. EXAM: CHEST  2 VIEW COMPARISON:  None. FINDINGS: The heart size and mediastinal contours are within normal limits. Both lungs are clear. The visualized skeletal structures are unremarkable. IMPRESSION:  No active cardiopulmonary disease. Electronically Signed   By: Gilmer Mor D.O.   On: 05/25/2017 21:23   Subjective: Still with some pain. Redness edema improved/   Discharge Exam: Vitals:   05/26/17 2315 05/27/17 0445  BP: (!) 92/50 (!) 98/58  Pulse: 76 64  Resp: 18 17  Temp: 97.6 F (36.4 C) 98.6 F (37 C)  SpO2: 100% 99%   Vitals:   05/26/17 0519 05/26/17 1431 05/26/17 2315 05/27/17 0445  BP: 92/60 109/63 (!) 92/50 (!) 98/58  Pulse: 60 77 76 64  Resp:  (!) Temp: 97.9 F (36.6 C) 97.8 F (36.6 C) 97.6 F (36.4 C) 98.6  F (37 C)  TempSrc: Oral Oral Oral Oral  SpO2: 100% 100% 100% 99%  Weight:      Height:        General: Pt is alert, awake, not in acute distress Cardiovascular: RRR, S1/S2 +, no rubs, no gallops Respiratory: CTA bilaterally, no wheezing, no rhonchi Abdominal: Soft, NT, ND, bowel sounds + Extremities: mild redness bilateral LE    The results of significant diagnostics from this hospitalization (including imaging, microbiology, ancillary and laboratory) are listed below for reference.     Microbiology: Recent Results (from the past 240 hour(s))  Blood culture (routine x 2)     Status: None (Preliminary result)   Collection Time: 05/25/17 11:36 PM  Result Value Ref Range Status   Specimen Description BLOOD BLOOD RIGHT FOREARM  Final   Special Requests   Final    BOTTLES DRAWN AEROBIC AND ANAEROBIC Blood Culture adequate volume   Culture NO GROWTH < 24 HOURS  Final   Report Status PENDING  Incomplete  Blood culture (routine x 2)     Status: None (Preliminary result)   Collection Time: 05/25/17 11:37 PM  Result Value Ref Range Status   Specimen Description BLOOD RIGHT WRIST  Final   Special Requests   Final    BOTTLES DRAWN AEROBIC AND ANAEROBIC Blood Culture adequate volume   Culture NO GROWTH < 24 HOURS  Final   Report Status PENDING  Incomplete     Labs: BNP (last 3 results) No results for input(s): BNP in the last 8760 hours. Basic Metabolic Panel:  Recent Labs Lab 05/24/17 2041 05/25/17 2138 05/26/17 0912  NA 135 140 142  K 3.1* 3.7 4.1  CL 101 108 113*  CO2 GLUCOSE 124* 96 92  BUN CREATININE 0.90 0.80 0.61  CALCIUM 8.8* 9.0 8.5*   Liver Function Tests:  Recent Labs Lab 05/24/17 2041 05/25/17 2138  AST 16 18  ALT 15 16  ALKPHOS 66 75  BILITOT 0.5 0.3  PROT 7.1 6.2*  ALBUMIN 3.9 3.6   No results for input(s): LIPASE, AMYLASE in the last 168 hours. No results for input(s): AMMONIA in the last 168 hours. CBC:  Recent  Labs Lab 05/24/17 2041 05/25/17 2138 05/26/17 0912  WBC 11.0* 7.4 4.7  NEUTROABS 7.9* 5.4  --   HGB 11.7* 10.2* 10.4*  HCT 36.9 31.8* 32.8*  MCV 87.6 87.6 88.4  PLT 218 203 198   Cardiac Enzymes: No results for input(s): CKTOTAL, CKMB, CKMBINDEX, TROPONINI in the last 168 hours. BNP: Invalid input(s): POCBNP CBG: No results for input(s): GLUCAP in the last 168 hours. D-Dimer No results for input(s): DDIMER in the last 72 hours. Hgb A1c No results for input(s): HGBA1C in the last 72 hours. Lipid Profile No results for input(s): CHOL,  HDL, LDLCALC, TRIG, CHOLHDL, LDLDIRECT in the last 72 hours. Thyroid function studies No results for input(s): TSH, T4TOTAL, T3FREE, THYROIDAB in the last 72 hours.  Invalid input(s): FREET3 Anemia work up No results for input(s): VITAMINB12, FOLATE, FERRITIN, TIBC, IRON, RETICCTPCT in the last 72 hours. Urinalysis    Component Value Date/Time   COLORURINE YELLOW 05/24/2017 2053   APPEARANCEUR CLEAR 05/24/2017 2053   LABSPEC 1.020 05/24/2017 2053   PHURINE 6.0 05/24/2017 2053   GLUCOSEU NEGATIVE 05/24/2017 2053   HGBUR NEGATIVE 05/24/2017 2053   BILIRUBINUR SMALL (A) 05/24/2017 2053   KETONESUR 15 (A) 05/24/2017 2053   PROTEINUR NEGATIVE 05/24/2017 2053   NITRITE NEGATIVE 05/24/2017 2053   LEUKOCYTESUR SMALL (A) 05/24/2017 2053   Sepsis Labs Invalid input(s): PROCALCITONIN,  WBC,  LACTICIDVEN Microbiology Recent Results (from the past 240 hour(s))  Blood culture (routine x 2)     Status: None (Preliminary result)   Collection Time: 05/25/17 11:36 PM  Result Value Ref Range Status   Specimen Description BLOOD BLOOD RIGHT FOREARM  Final   Special Requests   Final    BOTTLES DRAWN AEROBIC AND ANAEROBIC Blood Culture adequate volume   Culture NO GROWTH < 24 HOURS  Final   Report Status PENDING  Incomplete  Blood culture (routine x 2)     Status: None (Preliminary result)   Collection Time: 05/25/17 11:37 PM  Result Value Ref Range  Status   Specimen Description BLOOD RIGHT WRIST  Final   Special Requests   Final    BOTTLES DRAWN AEROBIC AND ANAEROBIC Blood Culture adequate volume   Culture NO GROWTH < 24 HOURS  Final   Report Status PENDING  Incomplete     Time coordinating discharge: Over 30 minutes  SIGNED:   Alba Cory, MD  [facility] 05/27/2017, 11:28 AM Pager   If 7PM-7AM, please contact night-coverage [email or web address] Password [email or web address]

## 2017-05-28 ENCOUNTER — Emergency Department (HOSPITAL_COMMUNITY)
Admission: EM | Admit: 2017-05-28 | Discharge: 2017-05-28 | Disposition: A | Discharge status: Home / Self Care | Payer: Self-pay | Attending: Emergency Medicine | Admitting: Emergency Medicine

## 2017-05-28 ENCOUNTER — Encounter (HOSPITAL_COMMUNITY): Payer: Self-pay | Admitting: Emergency Medicine

## 2017-05-28 DIAGNOSIS — Z5321 Procedure and treatment not carried out due to patient leaving prior to being seen by health care provider: Secondary | ICD-10-CM | POA: Insufficient documentation

## 2017-05-28 DIAGNOSIS — R2243 Localized swelling, mass and lump, lower limb, bilateral: Secondary | ICD-10-CM | POA: Insufficient documentation

## 2017-05-28 NOTE — ED Triage Notes (Signed)
Pt was discharged from hospital around 4pm today.  She came to ED waiting room around 8pm wanting to know if she could sleep in hospital.  Mariann Laster, Case Manager called to waiting room to talk to patient about homeless shelter.  Prior to [place] arriving to waiting room pt met another pt in the waiting room and they began making multiple trips outside to smoke.  When Mariann Laster arrived to talked to pt, the other pt (that has an ED care plan) stated that he was going to take care of pt and she was going home with him.  That pt kept asking for a bus pass to go home but had not been discharged yet (was seen by EDP in treatment room and sent to lobby to wait for lab results).  Once that pt was seen and discharged after 11pm, this patient came back to check-in and stated she needed to be seen for bilateral feet swelling and pain.  The gentleman that was discharged is still with her. Pt's feet are swollen and she has been seen recently for same.

## 2017-05-28 NOTE — ED Notes (Signed)
Called pt 3 X no response.

## 2017-05-30 LAB — CULTURE, BLOOD (ROUTINE X 2)
CULTURE: NO GROWTH
Culture: NO GROWTH
Special Requests: ADEQUATE
Special Requests: ADEQUATE

## 2017-07-18 ENCOUNTER — Emergency Department (HOSPITAL_COMMUNITY): Payer: Self-pay

## 2017-07-18 ENCOUNTER — Encounter (HOSPITAL_COMMUNITY): Payer: Self-pay | Admitting: Radiology

## 2017-07-18 ENCOUNTER — Inpatient Hospital Stay (HOSPITAL_COMMUNITY)
Admission: EM | Admit: 2017-07-18 | Discharge: 2017-07-21 | DRG: 917 | Disposition: A | Discharge status: Home / Self Care | Payer: Self-pay | Attending: Internal Medicine | Admitting: Internal Medicine

## 2017-07-18 DIAGNOSIS — G92 Toxic encephalopathy: Secondary | ICD-10-CM | POA: Diagnosis present

## 2017-07-18 DIAGNOSIS — Z880 Allergy status to penicillin: Secondary | ICD-10-CM

## 2017-07-18 DIAGNOSIS — F909 Attention-deficit hyperactivity disorder, unspecified type: Secondary | ICD-10-CM | POA: Diagnosis present

## 2017-07-18 DIAGNOSIS — F319 Bipolar disorder, unspecified: Secondary | ICD-10-CM | POA: Diagnosis present

## 2017-07-18 DIAGNOSIS — G9340 Encephalopathy, unspecified: Secondary | ICD-10-CM | POA: Diagnosis present

## 2017-07-18 DIAGNOSIS — Z91040 Latex allergy status: Secondary | ICD-10-CM

## 2017-07-18 DIAGNOSIS — R0902 Hypoxemia: Secondary | ICD-10-CM | POA: Diagnosis present

## 2017-07-18 DIAGNOSIS — J189 Pneumonia, unspecified organism: Secondary | ICD-10-CM | POA: Diagnosis present

## 2017-07-18 DIAGNOSIS — F1910 Other psychoactive substance abuse, uncomplicated: Secondary | ICD-10-CM | POA: Diagnosis present

## 2017-07-18 DIAGNOSIS — B1920 Unspecified viral hepatitis C without hepatic coma: Secondary | ICD-10-CM | POA: Diagnosis present

## 2017-07-18 DIAGNOSIS — M170 Bilateral primary osteoarthritis of knee: Secondary | ICD-10-CM | POA: Diagnosis present

## 2017-07-18 DIAGNOSIS — Z885 Allergy status to narcotic agent status: Secondary | ICD-10-CM

## 2017-07-18 DIAGNOSIS — R4182 Altered mental status, unspecified: Secondary | ICD-10-CM | POA: Diagnosis present

## 2017-07-18 DIAGNOSIS — R402352 Coma scale, best motor response, localizes pain, at arrival to emergency department: Secondary | ICD-10-CM | POA: Diagnosis present

## 2017-07-18 DIAGNOSIS — D649 Anemia, unspecified: Secondary | ICD-10-CM | POA: Diagnosis present

## 2017-07-18 DIAGNOSIS — F17210 Nicotine dependence, cigarettes, uncomplicated: Secondary | ICD-10-CM | POA: Diagnosis present

## 2017-07-18 DIAGNOSIS — J9601 Acute respiratory failure with hypoxia: Secondary | ICD-10-CM | POA: Diagnosis present

## 2017-07-18 DIAGNOSIS — Z3201 Encounter for pregnancy test, result positive: Secondary | ICD-10-CM | POA: Diagnosis present

## 2017-07-18 DIAGNOSIS — R402242 Coma scale, best verbal response, confused conversation, at arrival to emergency department: Secondary | ICD-10-CM | POA: Diagnosis present

## 2017-07-18 DIAGNOSIS — T405X1A Poisoning by cocaine, accidental (unintentional), initial encounter: Principal | ICD-10-CM | POA: Diagnosis present

## 2017-07-18 DIAGNOSIS — R402122 Coma scale, eyes open, to pain, at arrival to emergency department: Secondary | ICD-10-CM | POA: Diagnosis present

## 2017-07-18 DIAGNOSIS — F609 Personality disorder, unspecified: Secondary | ICD-10-CM | POA: Diagnosis present

## 2017-07-18 DIAGNOSIS — J181 Lobar pneumonia, unspecified organism: Secondary | ICD-10-CM

## 2017-07-18 DIAGNOSIS — F419 Anxiety disorder, unspecified: Secondary | ICD-10-CM | POA: Diagnosis present

## 2017-07-18 LAB — RAPID URINE DRUG SCREEN, HOSP PERFORMED
Amphetamines: NOT DETECTED
Barbiturates: NOT DETECTED
Benzodiazepines: NOT DETECTED
Cocaine: POSITIVE — AB
OPIATES: NOT DETECTED
Tetrahydrocannabinol: POSITIVE — AB

## 2017-07-18 LAB — COMPREHENSIVE METABOLIC PANEL
ALT: 19 U/L (ref 14–54)
ANION GAP: 10 (ref 5–15)
AST: 18 U/L (ref 15–41)
Albumin: 4.5 g/dL (ref 3.5–5.0)
Alkaline Phosphatase: 64 U/L (ref 38–126)
BUN: 10 mg/dL (ref 6–20)
CALCIUM: 8.9 mg/dL (ref 8.9–10.3)
CHLORIDE: 104 mmol/L (ref 101–111)
CO2: 23 mmol/L (ref 22–32)
CREATININE: 0.86 mg/dL (ref 0.44–1.00)
Glucose, Bld: 100 mg/dL — ABNORMAL HIGH (ref 65–99)
Potassium: 3.5 mmol/L (ref 3.5–5.1)
Sodium: 137 mmol/L (ref 135–145)
Total Bilirubin: 1.1 mg/dL (ref 0.3–1.2)
Total Protein: 7.4 g/dL (ref 6.5–8.1)

## 2017-07-18 LAB — CBC
HCT: 32.1 % — ABNORMAL LOW (ref 36.0–46.0)
Hemoglobin: 10.5 g/dL — ABNORMAL LOW (ref 12.0–15.0)
MCH: 27.7 pg (ref 26.0–34.0)
MCHC: 32.7 g/dL (ref 30.0–36.0)
MCV: 84.7 fL (ref 78.0–100.0)
PLATELETS: 227 10*3/uL (ref 150–400)
RBC: 3.79 MIL/uL — AB (ref 3.87–5.11)
RDW: 14 % (ref 11.5–15.5)
WBC: 10 10*3/uL (ref 4.0–10.5)

## 2017-07-18 LAB — ACETAMINOPHEN LEVEL

## 2017-07-18 LAB — I-STAT BETA HCG BLOOD, ED (MC, WL, AP ONLY): HCG, QUANTITATIVE: 56.6 m[IU]/mL — AB (ref <5)

## 2017-07-18 LAB — SALICYLATE LEVEL

## 2017-07-18 LAB — I-STAT CG4 LACTIC ACID, ED: LACTIC ACID, VENOUS: 0.7 mmol/L (ref 0.5–1.9)

## 2017-07-18 LAB — TROPONIN I

## 2017-07-18 LAB — HCG, QUANTITATIVE, PREGNANCY: hCG, Beta Chain, Quant, S: 61 m[IU]/mL — ABNORMAL HIGH (ref <5)

## 2017-07-18 LAB — ETHANOL

## 2017-07-18 LAB — CBG MONITORING, ED: Glucose-Capillary: 97 mg/dL (ref 65–99)

## 2017-07-18 LAB — PREGNANCY, URINE: PREG TEST UR: POSITIVE — AB

## 2017-07-18 MED ORDER — AMMONIA AROMATIC IN INHA
RESPIRATORY_TRACT | Status: AC
  Filled 2017-07-18: qty 10

## 2017-07-18 MED ORDER — ENOXAPARIN SODIUM 40 MG/0.4ML ~~LOC~~ SOLN
40.0000 mg | Freq: Every day | SUBCUTANEOUS | Status: DC
  Administered 2017-07-19 – 2017-07-20 (×2): 40 mg via SUBCUTANEOUS
  Filled 2017-07-18 (×3): qty 0.4

## 2017-07-18 MED ORDER — ACETAMINOPHEN 325 MG PO TABS: 650.0000 mg | ORAL_TABLET | Freq: Four times a day (QID) | ORAL | Status: DC | PRN

## 2017-07-18 MED ORDER — ACETAMINOPHEN 650 MG RE SUPP: 650.0000 mg | Freq: Four times a day (QID) | RECTAL | Status: DC | PRN

## 2017-07-18 MED ORDER — CLINDAMYCIN PHOSPHATE 600 MG/50ML IV SOLN
600.0000 mg | Freq: Two times a day (BID) | INTRAVENOUS | Status: DC
  Administered 2017-07-19 – 2017-07-20 (×4): 600 mg via INTRAVENOUS
  Filled 2017-07-18 (×6): qty 50

## 2017-07-18 MED ORDER — ONDANSETRON HCL 4 MG/2ML IJ SOLN: 4.0000 mg | Freq: Four times a day (QID) | INTRAMUSCULAR | Status: DC | PRN

## 2017-07-18 MED ORDER — DEXTROSE 5 % IV SOLN
500.0000 mg | Freq: Once | INTRAVENOUS | Status: AC
  Administered 2017-07-18: 500 mg via INTRAVENOUS
  Filled 2017-07-18: qty 500

## 2017-07-18 MED ORDER — SODIUM CHLORIDE 0.9 % IV SOLN
INTRAVENOUS | Status: DC
  Administered 2017-07-18 – 2017-07-21 (×4): via INTRAVENOUS

## 2017-07-18 MED ORDER — CLINDAMYCIN PHOSPHATE 600 MG/50ML IV SOLN
600.0000 mg | Freq: Once | INTRAVENOUS | Status: AC
  Administered 2017-07-18: 600 mg via INTRAVENOUS
  Filled 2017-07-18: qty 50

## 2017-07-18 MED ORDER — IPRATROPIUM-ALBUTEROL 0.5-2.5 (3) MG/3ML IN SOLN: 3.0000 mL | Freq: Four times a day (QID) | RESPIRATORY_TRACT | Status: DC | PRN

## 2017-07-18 MED ORDER — AZITHROMYCIN 500 MG IV SOLR
500.0000 mg | INTRAVENOUS | Status: DC
  Administered 2017-07-19: 500 mg via INTRAVENOUS
  Filled 2017-07-18 (×2): qty 500

## 2017-07-18 MED ORDER — ONDANSETRON HCL 4 MG PO TABS: 4.0000 mg | ORAL_TABLET | Freq: Four times a day (QID) | ORAL | Status: DC | PRN

## 2017-07-18 NOTE — ED Provider Notes (Signed)
5:03 PM Care transferred to me.  Patient is sleeping and when I go to arouse her, she takes a while to wake up and when she does she is disoriented.  She will not open her eyes except very rarely.  She does not appears sober enough for further reevaluation.  Continue to observe.  6:11 PM still resting, awakens when lightly shook, now a little more combative when awaking, then falls asleep. Likely still drugs and then haldol, given prolonged AMS, will add CXR and CT head.  7:18 PM patient has a low-grade temperature of 100.1, technically still not a fever.  However her chest x-ray is concerning for a left lower lobe pneumonia.  Concern for possible aspiration given drug abuse and altered mental status.  Given she is pregnant, will treat with a combination of clindamycin as azithromycin given the penicillin allergy and need to also cover for possible aspiration.  CT head benign.  Discussed with Dr. Katrinka BlazingSmith, given she is still not back to baseline and requiring oxygen she will need to be admitted.   Pricilla LovelessGoldston, Laiken Sandy, MD 07/18/17 [phone]

## 2017-07-18 NOTE — ED Notes (Signed)
Pt still not awake enough to speak with EDP.  Pt now sitting up with her eyes closed rocking back and forward only responding with head nodding.

## 2017-07-18 NOTE — ED Triage Notes (Addendum)
Per [facility]- witnessed bystanders observed bizarre behavior. [facility] motor movements and rapid incoherent thoughts.  Pt able to follow commands and and questions appropriately.  Pt unable to control herself safely. Given Haldol 5mg  IVP on scene. Pt tolerated. Upon arrival to ED. Pt calm and cooperative with care. Easily aroused and ACLS maintained.  By standers added she has HX of drug abuse.

## 2017-07-18 NOTE — ED Notes (Signed)
Patient transported to CT 
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
## 2017-07-18 NOTE — ED Notes (Signed)
[facility] Expected date:  Expected time:  Means of arrival:  Comments: EMS-behavioral issue

## 2017-07-18 NOTE — ED Notes (Signed)
PER EDP GOLDSTON PT REMAINS IN ACUTE SIDE FOR FURTHER EVALUATION

## 2017-07-18 NOTE — ED Notes (Signed)
ED Provider at bedside. EDP GOLDSTON PRESENT TO REEVALUATE THIS PT

## 2017-07-18 NOTE — ED Notes (Signed)
Admitting Provider at bedside. 

## 2017-07-18 NOTE — ED Notes (Signed)
EDP ALLAN STATES PT WILL NEED TO WAKE UP FOR TTS EVALUATION. UPDATED EDP ON POSITIVE PREGNANCY TEST

## 2017-07-18 NOTE — ED Notes (Signed)
ED Provider at bedside. 

## 2017-07-18 NOTE — ED Notes (Signed)
Patient alert, woke up to call of name. She sat herself straight up in the bed and coughed, clearing mucous from her throat. Uncooperative with main assessment and answering questions. Responding "get out and leave me alone". Swiping providers hands away saying "don't"
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
## 2017-07-19 ENCOUNTER — Other Ambulatory Visit: Payer: Self-pay

## 2017-07-19 ENCOUNTER — Inpatient Hospital Stay (HOSPITAL_COMMUNITY): Payer: Self-pay

## 2017-07-19 ENCOUNTER — Observation Stay (HOSPITAL_BASED_OUTPATIENT_CLINIC_OR_DEPARTMENT_OTHER)
Admission: EM | Admit: 2017-07-19 | Discharge: 2017-07-19 | Disposition: A | Discharge status: Home / Self Care | Payer: Self-pay | Source: Home / Self Care | Attending: Internal Medicine | Admitting: Internal Medicine

## 2017-07-19 ENCOUNTER — Encounter (HOSPITAL_COMMUNITY): Payer: Self-pay | Admitting: *Deleted

## 2017-07-19 DIAGNOSIS — D649 Anemia, unspecified: Secondary | ICD-10-CM | POA: Diagnosis present

## 2017-07-19 DIAGNOSIS — J189 Pneumonia, unspecified organism: Secondary | ICD-10-CM | POA: Diagnosis present

## 2017-07-19 DIAGNOSIS — R4182 Altered mental status, unspecified: Secondary | ICD-10-CM | POA: Diagnosis present

## 2017-07-19 DIAGNOSIS — R0902 Hypoxemia: Secondary | ICD-10-CM | POA: Diagnosis present

## 2017-07-19 DIAGNOSIS — G9340 Encephalopathy, unspecified: Secondary | ICD-10-CM

## 2017-07-19 DIAGNOSIS — F1910 Other psychoactive substance abuse, uncomplicated: Secondary | ICD-10-CM | POA: Diagnosis present

## 2017-07-19 LAB — CBC
HCT: 31.9 % — ABNORMAL LOW (ref 36.0–46.0)
Hemoglobin: 10.4 g/dL — ABNORMAL LOW (ref 12.0–15.0)
MCH: 28.3 pg (ref 26.0–34.0)
MCHC: 32.6 g/dL (ref 30.0–36.0)
MCV: 86.7 fL (ref 78.0–100.0)
PLATELETS: 183 10*3/uL (ref 150–400)
RBC: 3.68 MIL/uL — ABNORMAL LOW (ref 3.87–5.11)
RDW: 14.4 % (ref 11.5–15.5)
WBC: 16.6 10*3/uL — AB (ref 4.0–10.5)

## 2017-07-19 LAB — HIV ANTIBODY (ROUTINE TESTING W REFLEX): HIV SCREEN 4TH GENERATION: NONREACTIVE

## 2017-07-19 LAB — BASIC METABOLIC PANEL
Anion gap: 6 (ref 5–15)
BUN: 14 mg/dL (ref 6–20)
CALCIUM: 8.1 mg/dL — AB (ref 8.9–10.3)
CO2: 24 mmol/L (ref 22–32)
CREATININE: 0.9 mg/dL (ref 0.44–1.00)
Chloride: 102 mmol/L (ref 101–111)
GFR calc Af Amer: >60 mL/min (ref >60)
Glucose, Bld: 172 mg/dL — ABNORMAL HIGH (ref 65–99)
POTASSIUM: 3.2 mmol/L — AB (ref 3.5–5.1)
SODIUM: 132 mmol/L — AB (ref 135–145)

## 2017-07-19 LAB — AMMONIA: AMMONIA: 17 umol/L (ref 9–35)

## 2017-07-19 LAB — STREP PNEUMONIAE URINARY ANTIGEN: STREP PNEUMO URINARY ANTIGEN: NEGATIVE

## 2017-07-19 MED ORDER — POTASSIUM CHLORIDE 10 MEQ/100ML IV SOLN
10.0000 meq | INTRAVENOUS | Status: AC
  Administered 2017-07-19 (×4): 10 meq via INTRAVENOUS
  Filled 2017-07-19 (×4): qty 100

## 2017-07-19 MED ORDER — COMPLETENATE 29-1 MG PO CHEW
1.0000 | CHEWABLE_TABLET | Freq: Every day | ORAL | Status: DC
  Administered 2017-07-20 – 2017-07-21 (×2): 1 via ORAL
  Filled 2017-07-19 (×2): qty 1

## 2017-07-19 NOTE — Progress Notes (Signed)
Pt's sat decreased to 85%.  Congested breathing audible.  Humidification placed on O2 set up. Sats increased to 88-89%.  Resp therapist called to assess pt.

## 2017-07-19 NOTE — Progress Notes (Signed)
Pt refused labs this am.
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
## 2017-07-19 NOTE — Progress Notes (Signed)
Pt refused MRI. Stating for us to leave her the F alone.

## 2017-07-19 NOTE — Progress Notes (Signed)
Admitted pt from ED via stretcher.  Pt lethargic and does not respond to voice.  Unable to transfer self to bed from stretcher.  Pt not responding to commands and nsg staff unable to get admission questions answered.. Pt hypotensive.

## 2017-07-19 NOTE — Progress Notes (Signed)
Patient attempted again to void with no success. Finally allowed bladder scan. >999 in bladder. MD made aware, order to in and out cath. Performed with return of 1100cc yellow urine.

## 2017-07-19 NOTE — Progress Notes (Signed)
PT refused ABG- RN aware.

## 2017-07-19 NOTE — Progress Notes (Signed)
EEG Completed; Results Pending  

## 2017-07-19 NOTE — Progress Notes (Signed)
Per NT, patient has not voided since start of shift. Unknown about prior to shift start. Encourage patient many times to get up to go to the bathroom. Refused to try. Attempted to bladder scan but patient refused on two different attempts.

## 2017-07-19 NOTE — Progress Notes (Signed)
Patient to be transferred to 4th floor due to needing telemetry, report given to [place]risten, [place]

## 2017-07-19 NOTE — Procedures (Signed)
HPI:  31 y/o female with MS change.  Patient has a history of substance abuse.  TECHNICAL SUMMARY:  A multichannel referential and bipolar montage EEG using the standard international 10-20 system was performed on the patient described as lethargic.  During the brief portion of the recording in which the patient is most awake, there is a 9 Hz occipital dominant rhythm.  Much 7 Hz activity is intermixed throughout all head regions.  Low voltage fast activity is distributed symmetrically and maximally in the anterior head regions.  She does not participate with eye opening and closing activity.  ACTIVATION:  Stepwise photic stimulation and hyperventilation are not performed.  EPILEPTIFORM ACTIVITY:  There were no spikes, sharp waves or paroxysmal activity.  SLEEP: Physiologic drowsiness is noted, but no stage II sleep.  CARDIAC:  The EKG lead revealed a regular rhythm.    IMPRESSION:  This is an abnormal EEG demonstrating a mild diffuse slowing of electrocerebral activity.  This can be seen in a wide variety of encephalopathic state including those of a toxic, metabolic, or degenerative nature.  There were no focal, hemispheric, or lateralizing features.  No epileptiform activity was recorded.
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
## 2017-07-19 NOTE — Progress Notes (Addendum)
PROGRESS NOTE  Shannon LivelyKendi Munoz ZOX:[id number] DOB: 05-15-1986 DOA: 07/18/2017 PCP: Phineas RealLewis, Michael R, MD  HPI/Recap of past 24 hours: Pt is a 31 yo CF with PMH significant for IV drug use polysubstance abuse with cocaine and marijuana who presented on 07/18/17 with altered mental status. Patient reports the last time she injected IV drug was on Monday 07/18/17 roxy. States she has 4 children. Positive pregnancy test.  Upon evaluation this morning, pt is drowsy but arousable particularly after being told of a positive pregnancy test. Denies any pain. Hypoxic and requiring 6L O2 supplement by [employer] and saturating in the low 90's.  Assessment/Plan: Principal Problem:   Acute encephalopathy Active Problems:   Hepatitis C   Polysubstance abuse (HCC)   Normochromic normocytic anemia   Hypoxia   Pneumonia  Altered mental status, persistent -possibly multifactorial 2/2 to IV drug use vs others -EEG ordered -Neurology consulted -CT head negative for any acute findings -ammonia level -needs to be on remote telemetry -MRI brain  Acute hypoxic respiratory failure most likely 2/2 to CAP, poa -ABG to assess PCO2 and PaO2, PH -O2 supplement as needed to maintain O2 sat 92% or greater -continuous pulse oxymetry  CAP, present on admission -sputum cx,blood cx in process -lactic acid 0.7 -IV clindamycin, IV azithromycin day#1 -duonebs  Leukocytosis -possibly from CAP, poa vs others -blood cx x2 in process -lactic acid 0.7 -cbc am  IV drug user -hep C, hepatitis  Acute panel ordered -blood cx x2 in process -cbc am   Chronic normocytic anemia -stable -baseline appears to be 11 -mcv 84, hg 10 -no overt sign of bleeding  Suspect early pregnancy -OBGYN consult  Code Status: Full  Family Communication: No family members at bedside  Disposition Plan: Will stay another midnight due to persistent altered mental  status   Consultants:  Neurology  Procedures:  None  Antimicrobials:  IV clindamycin and IV azithromycin day #1  DVT prophylaxis:  Lovenox sq 40 mg daily   Objective: Vitals:   07/19/17 0030 07/19/17 0037 07/19/17 0100 07/19/17 0558  BP: (!) 77/42 99/75  108/65  Pulse: 86 84  84  Resp: 12   13  Temp: 98.5 F (36.9 C)   99.3 F (37.4 C)  TempSrc: Oral   Oral  SpO2: 91% 95%  93%  Weight:   62.6 kg (138 lb 0.1 oz)   Height:   5\' 4"  (1.626 m)     Intake/Output Summary (Last 24 hours) at 07/19/2017 [id number] Last data filed at 07/19/2017 0600 Gross per 24 hour  Intake 732.5 ml  Output 0 ml  Net 732.5 ml   Filed Weights   07/19/17 0100  Weight: 62.6 kg (138 lb 0.1 oz)    Exam:   General:  31 yo CF WD WN somnolent  Cardiovascular: RRR N rubs or gallops  Respiratory: CTA no wheezes or rhonchi  Abdomen: Soft NT ND NBS x4 quadrants  Musculoskeletal: Moves all limbs 2/4 pulses in all 4 extremities   Skin: No noted lesions  Psychiatry: Mood is appropriate for condition and setting.    Data Reviewed: CBC: Recent Labs  Lab 07/18/17 1237  WBC 10.0  HGB 10.5*  HCT 32.1*  MCV 84.7  PLT 227   Basic Metabolic Panel: Recent Labs  Lab 07/18/17 1237  NA 137  K 3.5  CL 104  CO2 23  GLUCOSE 100*  BUN 10  CREATININE 0.86  CALCIUM 8.9   GFR: Estimated Creatinine Clearance: 81.8 mL/min (by C-G formula  based on SCr of 0.86 mg/dL). Liver Function Tests: Recent Labs  Lab 07/18/17 1237  AST 18  ALT 19  ALKPHOS 64  BILITOT 1.1  PROT 7.4  ALBUMIN 4.5   No results for input(s): LIPASE, AMYLASE in the last 168 hours. No results for input(s): AMMONIA in the last 168 hours. Coagulation Profile: No results for input(s): INR, PROTIME in the last 168 hours. Cardiac Enzymes: Recent Labs  Lab 07/18/17 2038  TROPONINI <0.03   BNP (last 3 results) No results for input(s): PROBNP in the last 8760 hours. HbA1C: No results for input(s): HGBA1C in the last  72 hours. CBG: Recent Labs  Lab 07/18/17 1308  GLUCAP 97   Lipid Profile: No results for input(s): CHOL, HDL, LDLCALC, TRIG, CHOLHDL, LDLDIRECT in the last 72 hours. Thyroid Function Tests: No results for input(s): TSH, T4TOTAL, FREET4, T3FREE, THYROIDAB in the last 72 hours. Anemia Panel: No results for input(s): VITAMINB12, FOLATE, FERRITIN, TIBC, IRON, RETICCTPCT in the last 72 hours. Urine analysis:    Component Value Date/Time   COLORURINE YELLOW 05/24/2017 2053   APPEARANCEUR CLEAR 05/24/2017 2053   LABSPEC 1.020 05/24/2017 2053   PHURINE 6.0 05/24/2017 2053   GLUCOSEU NEGATIVE 05/24/2017 2053   HGBUR NEGATIVE 05/24/2017 2053   BILIRUBINUR SMALL (A) 05/24/2017 2053   KETONESUR 15 (A) 05/24/2017 2053   PROTEINUR NEGATIVE 05/24/2017 2053   NITRITE NEGATIVE 05/24/2017 2053   LEUKOCYTESUR SMALL (A) 05/24/2017 2053   Sepsis Labs: @LABRCNTIP (procalcitonin:4,lacticidven:4)  )No results found for this or any previous visit (from the past 240 hour(s)).    Studies: Ct Head Wo Contrast  Result Date: 07/18/2017 CLINICAL DATA:  Altered level consciousness EXAM: CT HEAD WITHOUT CONTRAST TECHNIQUE: Contiguous axial images were obtained from the base of the skull through the vertex without intravenous contrast. COMPARISON:  None. FINDINGS: Brain: No evidence of acute infarction, hemorrhage, hydrocephalus, extra-axial collection or mass lesion/mass effect. Vascular: No hyperdense vessel or unexpected calcification. Skull: No osseous abnormality. Sinuses/Orbits: Visualized paranasal sinuses are clear. Visualized mastoid sinuses are clear. Visualized orbits demonstrate no focal abnormality. Other: None IMPRESSION: No acute intracranial pathology. Electronically Signed   By: Elige KoHetal  Patel   On: 07/18/2017 18:54   Dg Chest Portable 1 View  Result Date: 07/18/2017 CLINICAL DATA:  Altered mental status. EXAM: PORTABLE CHEST 1 VIEW COMPARISON:  05/25/2017 FINDINGS: 1804 hours. Patchy airspace  disease left lung base compatible with pneumonia. Right lung clear. The cardiopericardial silhouette is within normal limits for size. The visualized bony structures of the thorax are intact. Telemetry leads overlie the chest. IMPRESSION: Patchy left basilar airspace disease compatible with pneumonia. Electronically Signed   By: Kennith CenterEric  Mansell M.D.   On: 07/18/2017 18:32    Scheduled Meds: . enoxaparin (LOVENOX) injection  40 mg Subcutaneous QHS    Continuous Infusions: . sodium chloride 75 mL/hr at 07/18/17 2014  . azithromycin    . clindamycin (CLEOCIN) IV       LOS: 0 days     Darlin Droparole N Jadasia Haws, MD [facility] Pager [phone]  If 7PM-7AM, please contact night-coverage [email or web address] Password [email or web address] 07/19/2017, 7:17 AM

## 2017-07-19 NOTE — Progress Notes (Signed)
Pt. Reported to MD that she had " shot up Roxis" on Monday prior to being brought to hospital.  When asked if she was trying to harm herself, she stated" No, I was just getting high"

## 2017-07-20 ENCOUNTER — Encounter (HOSPITAL_COMMUNITY): Payer: Self-pay

## 2017-07-20 ENCOUNTER — Other Ambulatory Visit: Payer: Self-pay

## 2017-07-20 DIAGNOSIS — R4182 Altered mental status, unspecified: Secondary | ICD-10-CM

## 2017-07-20 DIAGNOSIS — Z3A01 Less than 8 weeks gestation of pregnancy: Secondary | ICD-10-CM

## 2017-07-20 MED ORDER — AZITHROMYCIN 250 MG PO TABS
500.0000 mg | ORAL_TABLET | Freq: Every day | ORAL | Status: DC
  Administered 2017-07-20: 500 mg via ORAL
  Filled 2017-07-20: qty 2

## 2017-07-20 MED ORDER — CLONAZEPAM 0.5 MG PO TABS
0.5000 mg | ORAL_TABLET | Freq: Two times a day (BID) | ORAL | Status: DC
  Administered 2017-07-20 – 2017-07-21 (×3): 0.5 mg via ORAL
  Filled 2017-07-20 (×3): qty 1

## 2017-07-20 MED ORDER — NICOTINE 21 MG/24HR TD PT24
21.0000 mg | MEDICATED_PATCH | Freq: Every day | TRANSDERMAL | Status: DC
  Administered 2017-07-20 – 2017-07-21 (×2): 21 mg via TRANSDERMAL
  Filled 2017-07-20 (×2): qty 1

## 2017-07-20 NOTE — Progress Notes (Signed)
PROGRESS NOTE  Shannon Munoz [id number] DOB: August 19, 1985 DOA: 07/18/2017 PCP: Phineas RealLewis, Michael R, MD  HPI/Recap of past 24 hours: Pt is a 31 yo CF with PMH significant for IV drug use polysubstance abuse with cocaine and marijuana who presented on 07/18/17 with altered mental status. Patient reports the last time she injected IV drug was on Monday 07/18/17 roxy. States she has 4 children. Positive pregnancy test.  No acute events overnight. Pt seen and examined at her bedside. Mentation is improving. Case manager consulted to assist in finding drug rehab program for patient in light of new pregnancy. Pt admits to increase drug cravings and anxiety. Klonopin given after checking with OBGYN that it is safe in early pregnancy.   Assessment/Plan: Principal Problem:   Acute encephalopathy Active Problems:   Hepatitis C   Polysubstance abuse (HCC)   Normochromic normocytic anemia   Hypoxia   Pneumonia   Altered mental status  Altered mental status, resolving -possibly multifactorial 2/2 to IV drug use vs others -EEG no sign of seizure -Neurology followed -CT head negative for any acute findings -ammonia level normal 17 -MRI brain-refused by pt  Acute hypoxic respiratory failure most likely 2/2 to CAP, poa -improving -O2 supplement as needed to maintain O2 sat 92% or greater -continuous pulse oxymetry -IV azith, IV clinda day#2  CAP, present on admission -sputum cx -blood cx no growth 2 days -lactic acid 0.7 -IV clindamycin, IV azithromycin day#2 -duonebs  Leukocytosis -possibly from CAP, poa vs others -blood cx x2 in process -lactic acid 0.7 -cbc am  IV drug user -hep C -blood cx x2 no growth 2 days -cbc am -case manager consulted to find drug rehab  Chronic normocytic anemia -stable -baseline appears to be 11 -mcv 84, hg 10 -no overt sign of bleeding  Early pregnancy -OBGYN consult  Code Status: Full  Family Communication: No family members at  bedside  Disposition Plan: Will stay another midnight due to persistent altered mental status   Consultants:  Neurology  Procedures:  None  Antimicrobials:  IV clindamycin and IV azithromycin day #2  DVT prophylaxis:  Lovenox sq 40 mg daily   Objective: Vitals:   07/19/17 0558 07/19/17 1855 07/19/17 2130 07/20/17 0454  BP: 108/65  103/61 (!) 100/57  Pulse: 84  79 69  Resp: 13  20 20   Temp: 99.3 F (37.4 C)  99.1 F (37.3 C) 98.8 F (37.1 C)  TempSrc: Oral  Oral Oral  SpO2: 93% 94% 95% 96%  Weight:      Height:        Intake/Output Summary (Last 24 hours) at 07/20/2017 1634 Last data filed at 07/20/2017 1400 Gross per 24 hour  Intake 3640 ml  Output 1900 ml  Net 1740 ml   Filed Weights   07/19/17 0100  Weight: 62.6 kg (138 lb 0.1 oz)    Exam:   General:  31 yo CF WD WN NAD  Cardiovascular: RRR N rubs or gallops  Respiratory: CTA no wheezes or rhonchi  Abdomen: Soft NT ND NBS x4 quadrants  Musculoskeletal: Moves all limbs 2/4 pulses in all 4 extremities   Skin: No noted lesions  Psychiatry: Mood is appropriate for condition and setting.    Data Reviewed: CBC: Recent Labs  Lab 07/18/17 1237 07/19/17 0917  WBC 10.0 16.6*  HGB 10.5* 10.4*  HCT 32.1* 31.9*  MCV 84.7 86.7  PLT 227 183   Basic Metabolic Panel: Recent Labs  Lab 07/18/17 1237 07/19/17 0917  NA 137  132*  K 3.5 3.2*  CL 104 102  CO2 23 24  GLUCOSE 100* 172*  BUN 10 14  CREATININE 0.86 0.90  CALCIUM 8.9 8.1*   GFR: Estimated Creatinine Clearance: 78.2 mL/min (by C-G formula based on SCr of 0.9 mg/dL). Liver Function Tests: Recent Labs  Lab 07/18/17 1237  AST 18  ALT 19  ALKPHOS 64  BILITOT 1.1  PROT 7.4  ALBUMIN 4.5   No results for input(s): LIPASE, AMYLASE in the last 168 hours. Recent Labs  Lab 07/19/17 0917  AMMONIA 17   Coagulation Profile: No results for input(s): INR, PROTIME in the last 168 hours. Cardiac Enzymes: Recent Labs  Lab  07/18/17 2038  TROPONINI <0.03   BNP (last 3 results) No results for input(s): PROBNP in the last 8760 hours. HbA1C: No results for input(s): HGBA1C in the last 72 hours. CBG: Recent Labs  Lab 07/18/17 1308  GLUCAP 97   Lipid Profile: No results for input(s): CHOL, HDL, LDLCALC, TRIG, CHOLHDL, LDLDIRECT in the last 72 hours. Thyroid Function Tests: No results for input(s): TSH, T4TOTAL, FREET4, T3FREE, THYROIDAB in the last 72 hours. Anemia Panel: No results for input(s): VITAMINB12, FOLATE, FERRITIN, TIBC, IRON, RETICCTPCT in the last 72 hours. Urine analysis:    Component Value Date/Time   COLORURINE YELLOW 05/24/2017 2053   APPEARANCEUR CLEAR 05/24/2017 2053   LABSPEC 1.020 05/24/2017 2053   PHURINE 6.0 05/24/2017 2053   GLUCOSEU NEGATIVE 05/24/2017 2053   HGBUR NEGATIVE 05/24/2017 2053   BILIRUBINUR SMALL (A) 05/24/2017 2053   KETONESUR 15 (A) 05/24/2017 2053   PROTEINUR NEGATIVE 05/24/2017 2053   NITRITE NEGATIVE 05/24/2017 2053   LEUKOCYTESUR SMALL (A) 05/24/2017 2053   Sepsis Labs: @LABRCNTIP (procalcitonin:4,lacticidven:4)  ) Recent Results (from the past 240 hour(s))  Culture, blood (routine x 2)     Status: None (Preliminary result)   Collection Time: 07/18/17  7:22 PM  Result Value Ref Range Status   Specimen Description BLOOD RIGHT ARM  Final   Special Requests   Final    BOTTLES DRAWN AEROBIC AND ANAEROBIC Blood Culture adequate volume   Culture   Final    NO GROWTH 2 DAYS Performed at [facility], [address] [place], [place] [address]    Report Status PENDING  Incomplete  Culture, blood (routine x 2)     Status: None (Preliminary result)   Collection Time: 07/18/17  7:22 PM  Result Value Ref Range Status   Specimen Description BLOOD LEFT WRIST  Final   Special Requests   Final    BOTTLES DRAWN AEROBIC AND ANAEROBIC Blood Culture adequate volume   Culture   Final    NO GROWTH 2 DAYS Performed at [facility], [address] [place], [place] [address]    Report Status PENDING  Incomplete      Studies: No results found.  Scheduled Meds: . azithromycin  500 mg Oral QHS  . clonazePAM  0.5 mg Oral BID  . enoxaparin (LOVENOX) injection  40 mg Subcutaneous QHS  . nicotine  21 mg Transdermal Daily  . prenatal vitamin w/FE, FA  1 tablet Oral Q1200    Continuous Infusions: . sodium chloride 75 mL/hr at 07/19/17 1350  . clindamycin (CLEOCIN) IV Stopped (07/20/17 1143)     LOS: 1 day     Darlin Droparole N Ahsha Hinsley, MD [facility] Pager [phone]  If 7PM-7AM, please contact night-coverage [email or web address] Password [email or web address] 07/20/2017, 4:34 PM
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
## 2017-07-20 NOTE — Progress Notes (Signed)
Seen and examined patient today.  Currently has no neurological deficits. Patient refused MRI Brain. EEG normal.  Impression Altered mental status likely due to substance abuse.Now resolved.   Neurology will sign off.

## 2017-07-20 NOTE — Progress Notes (Signed)
PHARMACIST - PHYSICIAN COMMUNICATION DR:  Margo AyeHall CONCERNING: Antibiotic IV to Oral Route Change Policy  RECOMMENDATION: This patient is receiving azithromycin by the intravenous route.  Based on criteria approved by the Pharmacy and Therapeutics Committee, the antibiotic(s) is/are being converted to the equivalent oral dose form(s).   DESCRIPTION: These criteria include:  Patient being treated for a respiratory tract infection, urinary tract infection, cellulitis or clostridium difficile associated diarrhea if on metronidazole  The patient is not neutropenic and does not exhibit a GI malabsorption state  The patient is eating (either orally or via tube) and/or has been taking other orally administered medications for a least 24 hours  The patient is improving clinically and has a Tmax < 100.5  If you have questions about this conversion, please contact the Pharmacy Department  []   [phone]( [phone] )  [facility] []   [phone]( [phone] )  [facility] []   [phone]( [phone] )  [facility] []   [phone]( [phone] )  [facility] [x]   [phone]( [phone] )  [facility] Herby AbrahamMichelle T. Alann Avey, [place].D. [phone] 07/20/2017 7:51 AM l

## 2017-07-20 NOTE — Care Management Note (Signed)
Case Management Note  Patient Details  Name: Shannon Munoz MRN: [id number] Date of Birth: June 12, 1986  Subjective/Objective: 31 y/o f admitted w/Acute encephalopathy 2nd polysubstance abuse. Hx: IVDA,Hep C. From home. Has pcp. Noted per financial counselor-patient did not allow her to f/u on resources-currently medicaid potential. No CM needs.                  Action/Plan:d/c home.   Expected Discharge Date:  (unknown)               Expected Discharge Plan:  Home/Self Care  In-House Referral:  Clinical Social Work  Discharge planning Services  CM Consult  Post Acute Care Choice:    Choice offered to:     DME Arranged:    DME Agency:     HH Arranged:    HH Agency:     Status of Service:  In process, will continue to follow  If discussed at Long Length of Stay Meetings, dates discussed:    Additional Comments:  Lanier ClamMahabir, Jayleena Stille, RN 07/20/2017, 12:11 PM

## 2017-07-20 NOTE — Progress Notes (Signed)
Patient is becoming more alert this AM with some agitation at times. Patient is refusing to have her labs drawn. This patient was educated and still declines.  Will continue to monitor patient.

## 2017-07-21 LAB — LEGIONELLA PNEUMOPHILA SEROGP 1 UR AG: L. PNEUMOPHILA SEROGP 1 UR AG: NEGATIVE

## 2017-07-21 MED ORDER — AZITHROMYCIN 250 MG PO TABS: ORAL_TABLET | ORAL | 0 refills | Status: AC

## 2017-07-21 MED ORDER — CLINDAMYCIN HCL 300 MG PO CAPS
300.0000 mg | ORAL_CAPSULE | Freq: Three times a day (TID) | ORAL | Status: DC
  Administered 2017-07-21: 300 mg via ORAL
  Filled 2017-07-21: qty 1

## 2017-07-21 MED ORDER — COMPLETENATE 29-1 MG PO CHEW: 1.0000 | CHEWABLE_TABLET | Freq: Every day | ORAL | 0 refills | Status: AC

## 2017-07-21 MED ORDER — CLONAZEPAM 0.5 MG PO TABS: 0.5000 mg | ORAL_TABLET | Freq: Two times a day (BID) | ORAL | 0 refills | Status: AC

## 2017-07-21 MED ORDER — GUAIFENESIN-DM 100-10 MG/5ML PO SYRP
5.0000 mL | ORAL_SOLUTION | ORAL | Status: DC | PRN
  Administered 2017-07-21 (×3): 5 mL via ORAL
  Filled 2017-07-21 (×3): qty 10

## 2017-07-21 NOTE — Clinical Social Work Note (Signed)
Clinical Social Work Assessment  Patient Details  Name: Shannon LivelyKendi Boomershine MRN: [id number] Date of Birth: 1986-01-01  Date of referral:  07/21/17               Reason for consult:  Substance Use/ETOH Abuse                Permission sought to share information with:    Permission granted to share information::     Name::        Agency::     Relationship::     Contact Information:     Housing/Transportation Living arrangements for the past 2 months:  (Unknown) Source of Information:  Patient Patient Interpreter Needed:  None Criminal Activity/Legal Involvement Pertinent to Current Situation/Hospitalization:  No - Comment as needed Significant Relationships:  Other(Comment)(Unknown) Lives with:  Other (Comment)(Unknown) Do you feel safe going back to the place where you live?    Need for family participation in patient care:     Care giving concerns:  Patient reported none. CSW consulted for substance use.    Social Worker assessment / plan:  CSW consulted to speak with patient regarding substance use while pregnant. CSW spoke with patient at bedside, patient brief with CSW and denied any issues. Patient reported that she will not be using substances since she is pregnant and that she is in hurry to leave the hospital since her boyfriend has an appointment with his PO. CSW provided patient with outpatient substance abuse treatment resources and encouraged patient to follow up.   CSW signing off, patient provided with resources and encouragement.  Employment status:  Other (Comment)(Unknown) Insurance information:  Self Pay (Medicaid Pending) PT Recommendations:  Not assessed at this time Information / Referral to community resources:  Outpatient Substance Abuse Treatment Options  Patient/Family's Response to care:  Patient accepted resources.  Patient/Family's Understanding of and Emotional Response to Diagnosis, Current Treatment, and Prognosis:   Patient presented with little  emotional response, Patient brief and dismissive when speaking with CSW.   Emotional Assessment Appearance:  Appears stated age Attitude/Demeanor/Rapport:  Other(Dismissive) Affect (typically observed):  Restless Orientation:  Oriented to Self, Oriented to Place, Oriented to Situation Alcohol / Substance use:  Illicit Drugs Psych involvement (Current and /or in the community):  No (Comment)  Discharge Needs  Concerns to be addressed:  Substance Abuse Concerns Readmission within the last 30 days:  No Current discharge risk:  Substance Abuse Barriers to Discharge:  No Barriers Identified   Antionette PolesKimberly L Daquon Greenleaf, LCSW 07/21/2017, 1:53 PM

## 2017-07-21 NOTE — Progress Notes (Signed)
Went into rm to clarify to patient that [facility] to [facility] her med[facility]-patient voiced understanding. Also informed attending [facility]. No further CM needs.

## 2017-07-21 NOTE — Discharge Summary (Addendum)
Discharge Summary  Shannon Munoz ZOX:[id number] DOB: November 16, 1985  PCP: Phineas Real, MD  Admit date: 07/18/2017 Discharge date: 07/21/2017  Time spent: 25 minutes  Recommendations for Outpatient Follow-up:  1. Follow up with OBGYN at the [facility] for high risk pregnancy 2. Follow up with your PCP within a week 3. Take your medications as prescribed 4. Abstain from doing drugs, alcohol or tobacco use (early pregnancy) 5. Schedule for f/u at OBGYN office for prenatal care [phone] in 2-3 months. Center for [facility] -    Discharge Diagnoses:  Active Hospital Problems   Diagnosis Date Noted  . Acute encephalopathy 07/18/2017  . Polysubstance abuse (HCC) 07/19/2017  . Normochromic normocytic anemia 07/19/2017  . Hypoxia 07/19/2017  . Pneumonia 07/19/2017  . Altered mental status 07/19/2017  . Hepatitis C     Resolved Hospital Problems  No resolved problems to display.    Discharge Condition: stable  Diet recommendation: Resume previous diet  Vitals:   07/21/17 0619 07/21/17 1100  BP: 96/62   Pulse: 83   Resp: 18   Temp: 97.9 F (36.6 C)   SpO2: 100% 98%    History of present illness:  Pt is a 31 yo CF with PMH significant for IV drug use polysubstance abuse with cocaine and marijuana who presented on 07/18/17 with altered mental status. Patient reports the last time she injected IV drug was on Monday 07/18/17 roxy. States she has 4 children. Positive pregnancy test.  Mentation is back to her baseline. Case manager consulted to assist in finding drug rehab program for patient in light of new pregnancy. Pt admits to increase drug cravings and anxiety. Klonopin given after checking with OBGYN that it is safe in early pregnancy.  CAP, present on admission treated with IV antibiotics and responded well to treatment with IV clindmaycin and IV azithromycin (for 3 days) which were approved by OBGYN.  On the day of discharge the pt was hemodynamically  stable. Patient advised to cease IV drug use or illicit drug use, to take meds as prescribed and f/u with OBGYN and PCP. Patient understands and agrees to plan.    Hospital Course:  Principal Problem:   Acute encephalopathy Active Problems:   Hepatitis C   Polysubstance abuse (HCC)   Normochromic normocytic anemia   Hypoxia   Pneumonia   Altered mental status  Altered mental status, resolved -possibly multifactorial 2/2 to IV drug use vs others -EEG no sign of seizure -Neurology followed -CT head negative for any acute findings -ammonia level normal 17 -MRI brain-refused by pt  Acute hypoxic respiratory failure most likely 2/2 to CAP, poa, resolved -improving -O2 supplement as needed to maintain O2 sat 92% or greater -continuous pulse oxymetry -IV azith, IV clinda day#2  CAP, present on admission, resolving -sputum cx -blood cx no growth 2 days -lactic acid 0.7 -IV clindamycin, IV azithromycin day#3 -duonebs prn -continue po azithromycin  Leukocytosis, resolving -possibly from CAP, poa vs others -blood cx x2 in process -lactic acid 0.7  IV drug user -hep C -blood cx x2 no growth 2 days -cbc am -case manager consulted to find help  Chronic normocytic anemia -stable -baseline appears to be 11 -mcv 84, hg 10 -no overt sign of bleeding  Early pregnancy -OBGYN consulted -follow up with high risk OBGYN    Procedures:  None  Consultations:  OBGYN  Neuro  Discharge Exam: BP 96/62 (BP Location: Left Arm)   Pulse 83   Temp 97.9 F (36.6 C) (Oral)  Resp 18   Ht 5\' 4"  (1.626 m)   Wt 62.6 kg (138 lb 0.1 oz)   LMP 05/12/2017 (Within Days)   SpO2 98%   BMI 23.69 kg/m    General:  31 yo CF WD WN NAD  Cardiovascular: RRR N rubs or gallops  Respiratory: CTA no wheezes or rhonchi  Abdomen: Soft NT ND NBS x4 quadrants  Musculoskeletal: Moves all limbs 2/4 pulses in all 4 extremities   Skin: No noted lesions  Psychiatry: Mood is  appropriate for condition and setting.    Discharge Instructions You were cared for by a hospitalist during your hospital stay. If you have any questions about your discharge medications or the care you received while you were in the hospital after you are discharged, you can call the unit and asked to speak with the hospitalist on call if the hospitalist that took care of you is not available. Once you are discharged, your primary care physician will handle any further medical issues. Please note that NO REFILLS for any discharge medications will be authorized once you are discharged, as it is imperative that you return to your primary care physician (or establish a relationship with a primary care physician if you do not have one) for your aftercare needs so that they can reassess your need for medications and monitor your lab values.   Allergies as of 07/21/2017      Reactions   Darvon [propoxyphene] Anaphylaxis   Penicillins Anaphylaxis   Has patient had a PCN reaction causing immediate rash, facial/tongue/throat swelling, SOB or lightheadedness with hypotension:  yes Has patient had a PCN reaction causing severe rash involving mucus membranes or skin necrosis:  no Has patient had a PCN reaction that required hospitalization:  no Has patient had a PCN reaction occurring within the last 10 years: no If all of the above answers are "NO", then may proceed with Cephalosporin use.   Latex Rash      Medication List    STOP taking these medications   acetaminophen 325 MG tablet Commonly known as:  TYLENOL   doxycycline 100 MG capsule Commonly known as:  VIBRAMYCIN   ibuprofen 800 MG tablet Commonly known as:  ADVIL,MOTRIN     TAKE these medications   azithromycin 250 MG tablet Commonly known as:  ZITHROMAX Please take as directed on package   clonazePAM 0.5 MG tablet Commonly known as:  KLONOPIN Take 1 tablet (0.5 mg total) by mouth 2 (two) times daily.   prenatal vitamin w/FE,  FA 29-1 MG Chew chewable tablet Chew 1 tablet by mouth daily at 12 noon. Start taking on:  07/22/2017      Allergies  Allergen Reactions  . Darvon [Propoxyphene] Anaphylaxis  . Penicillins Anaphylaxis    Has patient had a PCN reaction causing immediate rash, facial/tongue/throat swelling, SOB or lightheadedness with hypotension:  yes Has patient had a PCN reaction causing severe rash involving mucus membranes or skin necrosis:  no Has patient had a PCN reaction that required hospitalization:  no Has patient had a PCN reaction occurring within the last 10 years: no If all of the above answers are "NO", then may proceed with Cephalosporin use.   . Latex Rash   Follow-up Information    [facility]. Go to.   Why:  @ d/c arrive prior 3p Contact information: [facility] [address] [place] [address] [phone]       Phineas Real, MD Follow up.  Specialty:  Family Medicine Contact information: [address] [facility] [place] [facility] [id number] [phone]            The results of significant diagnostics from this hospitalization (including imaging, microbiology, ancillary and laboratory) are listed below for reference.    Significant Diagnostic Studies: Ct Head Wo Contrast  Result Date: 07/18/2017 CLINICAL DATA:  Altered level consciousness EXAM: CT HEAD WITHOUT CONTRAST TECHNIQUE: Contiguous axial images were obtained from the base of the skull through the vertex without intravenous contrast. COMPARISON:  None. FINDINGS: Brain: No evidence of acute infarction, hemorrhage, hydrocephalus, extra-axial collection or mass lesion/mass effect. Vascular: No hyperdense vessel or unexpected calcification. Skull: No osseous abnormality. Sinuses/Orbits: Visualized paranasal sinuses are clear. Visualized mastoid sinuses are clear. Visualized orbits demonstrate no focal abnormality. Other: None IMPRESSION: No acute intracranial pathology.  Electronically Signed   By: Elige KoHetal  Patel   On: 07/18/2017 18:54   Dg Chest Portable 1 View  Result Date: 07/18/2017 CLINICAL DATA:  Altered mental status. EXAM: PORTABLE CHEST 1 VIEW COMPARISON:  05/25/2017 FINDINGS: 1804 hours. Patchy airspace disease left lung base compatible with pneumonia. Right lung clear. The cardiopericardial silhouette is within normal limits for size. The visualized bony structures of the thorax are intact. Telemetry leads overlie the chest. IMPRESSION: Patchy left basilar airspace disease compatible with pneumonia. Electronically Signed   By: Kennith CenterEric  Mansell M.D.   On: 07/18/2017 18:32    Microbiology: Recent Results (from the past 240 hour(s))  Culture, blood (routine x 2)     Status: None (Preliminary result)   Collection Time: 07/18/17  7:22 PM  Result Value Ref Range Status   Specimen Description BLOOD RIGHT ARM  Final   Special Requests   Final    BOTTLES DRAWN AEROBIC AND ANAEROBIC Blood Culture adequate volume   Culture   Final    NO GROWTH 3 DAYS Performed at [facility], [address] [place], [place] [address]    Report Status PENDING  Incomplete  Culture, blood (routine x 2)     Status: None (Preliminary result)   Collection Time: 07/18/17  7:22 PM  Result Value Ref Range Status   Specimen Description BLOOD LEFT WRIST  Final   Special Requests   Final    BOTTLES DRAWN AEROBIC AND ANAEROBIC Blood Culture adequate volume   Culture   Final    NO GROWTH 3 DAYS Performed at [facility], [address] [place], [place] [address]    Report Status PENDING  Incomplete     Labs: Basic Metabolic Panel: Recent Labs  Lab 07/18/17 1237 07/19/17 0917  NA 137 132*  K 3.5 3.2*  CL 104 102  CO2 23 24  GLUCOSE 100* 172*  BUN 10 14  CREATININE 0.86 0.90  CALCIUM 8.9 8.1*   Liver Function Tests: Recent Labs  Lab 07/18/17 1237  AST 18  ALT 19  ALKPHOS 64  BILITOT 1.1  PROT 7.4  ALBUMIN 4.5   No results for input(s): LIPASE,  AMYLASE in the last 168 hours. Recent Labs  Lab 07/19/17 0917  AMMONIA 17   CBC: Recent Labs  Lab 07/18/17 1237 07/19/17 0917  WBC 10.0 16.6*  HGB 10.5* 10.4*  HCT 32.1* 31.9*  MCV 84.7 86.7  PLT 227 183   Cardiac Enzymes: Recent Labs  Lab 07/18/17 2038  TROPONINI <0.03   BNP: BNP (last 3 results) No results for input(s): BNP in the last 8760 hours.  ProBNP (  last 3 results) No results for input(s): PROBNP in the last 8760 hours.  CBG: Recent Labs  Lab 07/18/17 1308  GLUCAP 97       Signed:  Darlin Droparole N Hall, MD [facility] 07/21/2017, 1:48 PM

## 2017-07-21 NOTE — Discharge Instructions (Signed)

## 2017-07-21 NOTE — Progress Notes (Signed)
Received call from tele monitor sitter that patient and patient's significant other were engaged in coitus.  CN went into patient's room and abruptly stopped. Within an hour, RN received another phone call that patient was again engaging in coitus with significant other. RN went into patient's room and abruptly stopped again. Will continue to monitor.

## 2017-07-21 NOTE — Care Management Note (Signed)
Case Management Note  Patient Details  Name: Shannon Munoz MRN: [id number] Date of Birth: 1986/06/27  Subjective/Objective:  Spoke to patient about d/c plans. [facility],she is able to get her meds there for free(klonopin-controlled substance-[facility] will provide a substitute, zithromax-they have available)& has a NP-Mary Placey. I have spoken to Mee HivesMary Placey NP she will be able to provide medical assistance. Patient familiar w/[facility]. She must arrive there prior 3p. No further CM needs.MD updated.                  Action/Plan:d/c home.   Expected Discharge Date:  (unknown)               Expected Discharge Plan:  Home/Self Care  In-House Referral:  Clinical Social Work  Discharge planning Services  CM Consult, Indigent [facility] Clinic  Post Acute Care Choice:    Choice offered to:     DME Arranged:    DME Agency:     HH Arranged:    HH Agency:     Status of Service:  Completed, signed off  If discussed at [employer] Length of [employer] Meetings, dates discussed:    Additional Comments:  Lanier ClamMahabir, Shany Marinez, RN 07/21/2017, 10:50 AM

## 2017-07-21 NOTE — Progress Notes (Signed)
PHARMACIST - PHYSICIAN COMMUNICATION DR:   Margo AyeHall CONCERNING: Antibiotic IV to Oral Route Change Policy  RECOMMENDATION: This patient is receiving clindamycin by the intravenous route.  Based on criteria approved by the Pharmacy and Therapeutics Committee, the antibiotic(s) is/are being converted to the equivalent oral dose form(s).   DESCRIPTION: These criteria include:  Patient being treated for a respiratory tract infection, urinary tract infection, cellulitis or clostridium difficile associated diarrhea if on metronidazole  The patient is not neutropenic and does not exhibit a GI malabsorption state  The patient is eating (either orally or via tube) and/or has been taking other orally administered medications for a least 24 hours  The patient is improving clinically and has a Tmax < 100.5  If you have questions about this conversion, please contact the Pharmacy Department  []   [phone]( [phone] )  [facility] []   [phone]( [phone] )  [facility] []   [phone]( [phone] )  [facility] []   [phone]( [phone] )  [facility] [x]   [phone] )  [facility]  Herby AbrahamMichelle T. Nichele Slawson, [place]. [phone] 07/21/2017 9:48 AM

## 2017-07-23 LAB — CULTURE, BLOOD (ROUTINE X 2)
Culture: NO GROWTH
Culture: NO GROWTH
SPECIAL REQUESTS: ADEQUATE
Special Requests: ADEQUATE

## 2017-11-04 ENCOUNTER — Encounter (HOSPITAL_COMMUNITY): Payer: Self-pay | Admitting: Emergency Medicine

## 2017-11-04 ENCOUNTER — Emergency Department (HOSPITAL_COMMUNITY)
Admission: EM | Admit: 2017-11-04 | Discharge: 2017-11-04 | Disposition: A | Discharge status: Home / Self Care | Payer: Medicaid Other | Attending: Emergency Medicine | Admitting: Emergency Medicine

## 2017-11-04 ENCOUNTER — Other Ambulatory Visit (HOSPITAL_COMMUNITY): Payer: Self-pay

## 2017-11-04 DIAGNOSIS — R109 Unspecified abdominal pain: Secondary | ICD-10-CM | POA: Insufficient documentation

## 2017-11-04 DIAGNOSIS — O9989 Other specified diseases and conditions complicating pregnancy, childbirth and the puerperium: Secondary | ICD-10-CM | POA: Insufficient documentation

## 2017-11-04 DIAGNOSIS — Z3A01 Less than 8 weeks gestation of pregnancy: Secondary | ICD-10-CM | POA: Insufficient documentation

## 2017-11-04 DIAGNOSIS — Z5321 Procedure and treatment not carried out due to patient leaving prior to being seen by health care provider: Secondary | ICD-10-CM | POA: Insufficient documentation

## 2017-11-04 LAB — CBC
HCT: 34 % — ABNORMAL LOW (ref 36.0–46.0)
HEMOGLOBIN: 10.9 g/dL — AB (ref 12.0–15.0)
MCH: 28.2 pg (ref 26.0–34.0)
MCHC: 32.1 g/dL (ref 30.0–36.0)
MCV: 88.1 fL (ref 78.0–100.0)
PLATELETS: 151 10*3/uL (ref 150–400)
RBC: 3.86 MIL/uL — AB (ref 3.87–5.11)
RDW: 15.1 % (ref 11.5–15.5)
WBC: 9.3 10*3/uL (ref 4.0–10.5)

## 2017-11-04 LAB — COMPREHENSIVE METABOLIC PANEL
ALT: 13 U/L — ABNORMAL LOW (ref 14–54)
ANION GAP: 11 (ref 5–15)
AST: 15 U/L (ref 15–41)
Albumin: 3.7 g/dL (ref 3.5–5.0)
Alkaline Phosphatase: 52 U/L (ref 38–126)
BILIRUBIN TOTAL: 0.4 mg/dL (ref 0.3–1.2)
BUN: 9 mg/dL (ref 6–20)
CO2: 22 mmol/L (ref 22–32)
CREATININE: 0.49 mg/dL (ref 0.44–1.00)
Calcium: 9.4 mg/dL (ref 8.9–10.3)
Chloride: 102 mmol/L (ref 101–111)
GFR calc non Af Amer: >60 mL/min (ref >60)
Glucose, Bld: 96 mg/dL (ref 65–99)
Potassium: 4.5 mmol/L (ref 3.5–5.1)
Sodium: 135 mmol/L (ref 135–145)
TOTAL PROTEIN: 7.4 g/dL (ref 6.5–8.1)

## 2017-11-04 LAB — LIPASE, BLOOD: Lipase: 29 U/L (ref 11–51)

## 2017-11-04 LAB — I-STAT BETA HCG BLOOD, ED (MC, WL, AP ONLY)

## 2017-11-04 NOTE — ED Notes (Signed)
No answer when called for recheck on vital signs 

## 2017-11-04 NOTE — ED Notes (Signed)
No answer when called for room 

## 2017-11-04 NOTE — ED Triage Notes (Addendum)
Per pt, states she has been having "vague" abdominal pains for 2 days-states right and left side of abdomin-states sometimes radiating down to pelvis-states pregnant with 5th child-all deliveries were done by C-section-states she vomited twice today-no bleeding or discharge-states she is out of her Klonopin

## 2017-11-07 NOTE — ED Notes (Signed)
11/07/2017   , Attempted follow-up call , no answer. 

## 2018-07-24 IMAGING — DX DG CHEST 2V
2 series · 2 of 2 positions shown · non-contrast
Comparison: None.

CLINICAL DATA: 30-year-old female with a history of drug abuse.

EXAM:
CHEST  2 VIEW

[chest lat]
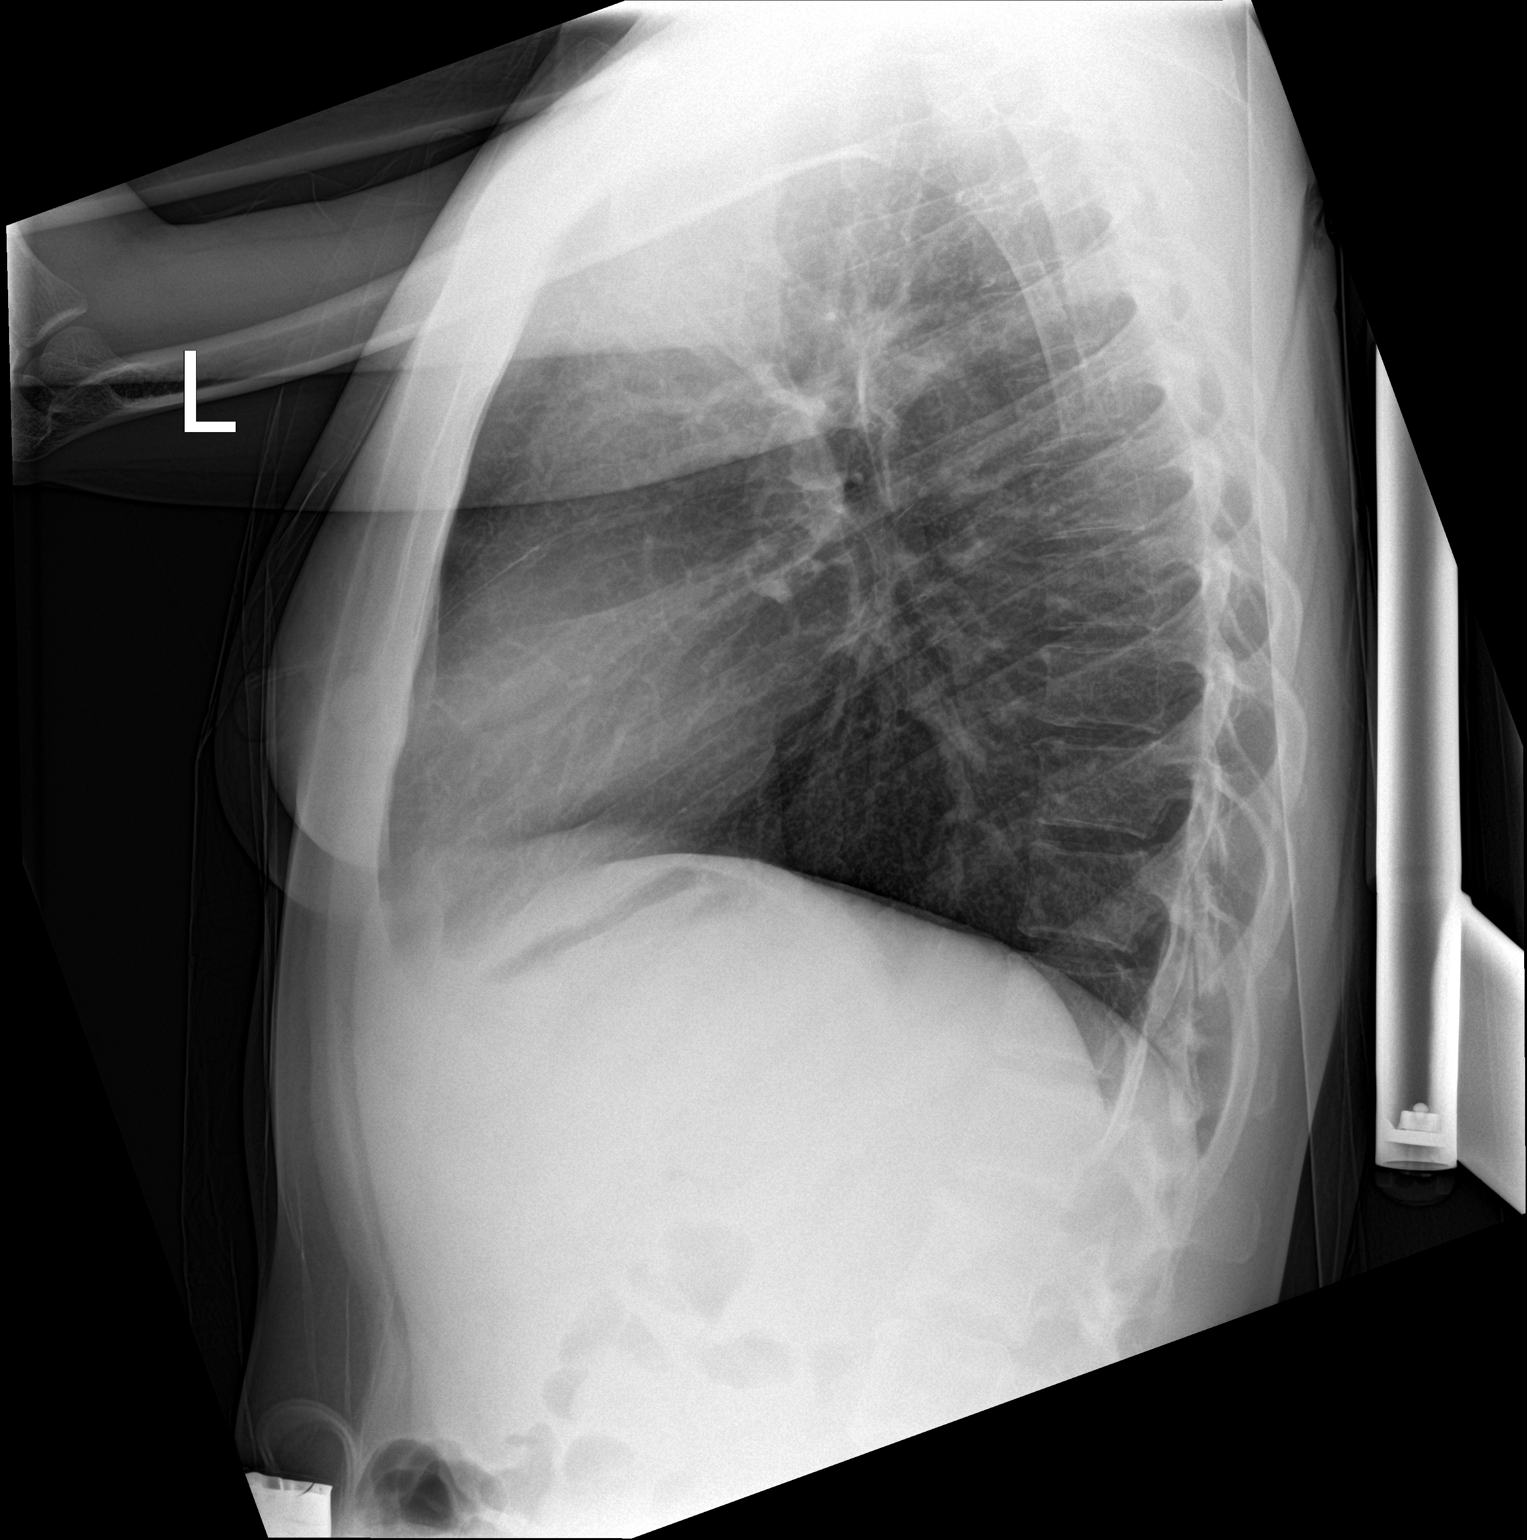

[chest ap]
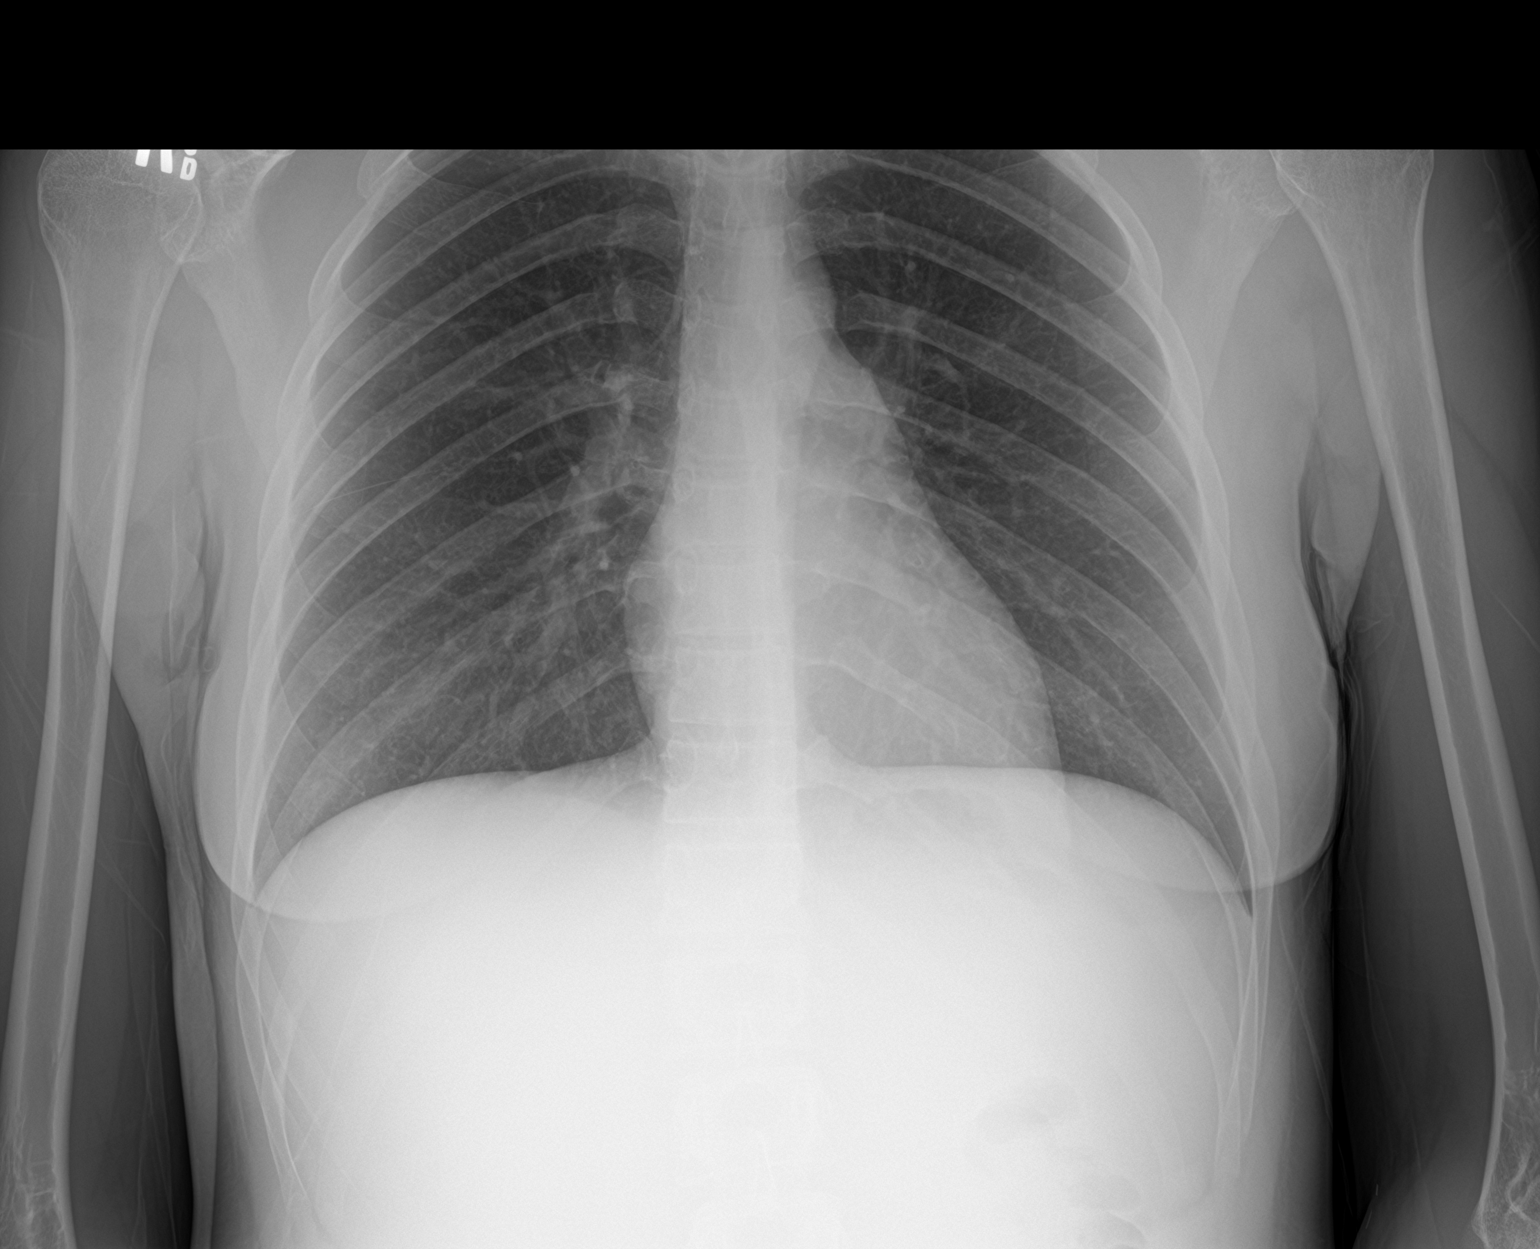

[2 of 2 positions shown; findings below may reference images not displayed]

FINDINGS: The heart size and mediastinal contours are within normal limits.
Both lungs are clear. The visualized skeletal structures are
unremarkable.
IMPRESSION: No active cardiopulmonary disease.

## 2022-08-06 ENCOUNTER — Encounter (HOSPITAL_COMMUNITY): Payer: Self-pay

## 2022-08-06 ENCOUNTER — Other Ambulatory Visit: Payer: Self-pay

## 2022-08-06 ENCOUNTER — Inpatient Hospital Stay
Admission: AD | Admit: 2022-08-06 | Discharge: 2022-08-17 | DRG: 861 | Disposition: A | Discharge status: Home / Self Care | Payer: 59 | Source: Ambulatory Visit | Attending: PSYCHIATRY | Admitting: PSYCHIATRY

## 2022-08-06 DIAGNOSIS — F29 Unspecified psychosis not due to a substance or known physiological condition: Secondary | ICD-10-CM

## 2022-08-06 DIAGNOSIS — R404 Transient alteration of awareness: Secondary | ICD-10-CM

## 2022-08-06 DIAGNOSIS — R45851 Suicidal ideations: Secondary | ICD-10-CM | POA: Diagnosis present

## 2022-08-06 DIAGNOSIS — F1110 Opioid abuse, uncomplicated: Secondary | ICD-10-CM | POA: Diagnosis present

## 2022-08-06 DIAGNOSIS — A599 Trichomoniasis, unspecified: Secondary | ICD-10-CM | POA: Diagnosis present

## 2022-08-06 DIAGNOSIS — R21 Rash and other nonspecific skin eruption: Secondary | ICD-10-CM | POA: Diagnosis not present

## 2022-08-06 DIAGNOSIS — Z046 Encounter for general psychiatric examination, requested by authority: Principal | ICD-10-CM

## 2022-08-06 DIAGNOSIS — B3731 Acute candidiasis of vulva and vagina: Secondary | ICD-10-CM | POA: Diagnosis present

## 2022-08-06 DIAGNOSIS — Z79899 Other long term (current) drug therapy: Secondary | ICD-10-CM

## 2022-08-06 DIAGNOSIS — F952 Tourette's disorder: Secondary | ICD-10-CM | POA: Diagnosis present

## 2022-08-06 DIAGNOSIS — F315 Bipolar disorder, current episode depressed, severe, with psychotic features: Secondary | ICD-10-CM | POA: Diagnosis present

## 2022-08-06 DIAGNOSIS — F419 Anxiety disorder, unspecified: Secondary | ICD-10-CM | POA: Diagnosis present

## 2022-08-06 DIAGNOSIS — Z1152 Encounter for screening for COVID-19: Secondary | ICD-10-CM

## 2022-08-06 DIAGNOSIS — F17213 Nicotine dependence, cigarettes, with withdrawal: Secondary | ICD-10-CM | POA: Diagnosis present

## 2022-08-06 DIAGNOSIS — F1520 Other stimulant dependence, uncomplicated: Secondary | ICD-10-CM | POA: Diagnosis present

## 2022-08-06 DIAGNOSIS — F1210 Cannabis abuse, uncomplicated: Secondary | ICD-10-CM | POA: Diagnosis present

## 2022-08-06 LAB — CBC WITH DIFF
BASOPHIL #: 0 10*3/uL (ref 0.00–0.30)
BASOPHIL %: 0 % (ref 0–1)
EOSINOPHIL #: 0.1 10*3/uL (ref 0.00–0.50)
EOSINOPHIL %: 1 % (ref 0–4)
HCT: 37.7 % (ref 37.0–47.0)
HGB: 12.5 g/dL (ref 12.5–16.0)
LYMPHOCYTE #: 1.2 10*3/uL (ref 0.90–4.80)
LYMPHOCYTE %: 18 % — ABNORMAL LOW (ref 23–35)
MCH: 28.5 pg (ref 28.0–33.0)
MCHC: 33.2 g/dL (ref 32.0–37.0)
MCV: 85.9 fL (ref 78.0–100.0)
MONOCYTE #: 0.4 10*3/uL (ref 0.30–0.90)
MONOCYTE %: 6 % (ref 0–12)
NEUTROPHIL #: 5 10*3/uL (ref 1.70–7.00)
NEUTROPHIL %: 75 % — ABNORMAL HIGH (ref 50–70)
PLATELETS: 187 10*3/uL (ref 130–400)
RBC: 4.39 10*6/uL (ref 4.20–5.40)
RDW: 15 % (ref 9.9–16.5)
WBC: 6.6 10*3/uL (ref 4.5–11.0)

## 2022-08-06 LAB — DRUG SCREEN, NO CONFIRMATION, URINE
AMPHETAMINES, URINE: NEGATIVE
BARBITURATES URINE: POSITIVE — AB
BENZODIAZEPINES URINE: NEGATIVE
BUPRENORPHINE URINE: NEGATIVE
CANNABINOIDS URINE: NEGATIVE
COCAINE METABOLITES URINE: NEGATIVE
CREATININE RANDOM URINE: 105 mg/dL — ABNORMAL HIGH (ref 50–100)
ECSTASY/MDMA URINE: NEGATIVE
FENTANYL, RANDOM URINE: NEGATIVE
METHADONE URINE: NEGATIVE
OPIATES URINE (LOW CUTOFF): NEGATIVE
OXYCODONE URINE: NEGATIVE

## 2022-08-06 LAB — URINALYSIS, MACROSCOPIC
BILIRUBIN: NEGATIVE mg/dL
GLUCOSE: NEGATIVE mg/dL
KETONES: NEGATIVE mg/dL
NITRITE: NEGATIVE
PH: 6 (ref 5.0–8.0)
PROTEIN: 10 mg/dL
SPECIFIC GRAVITY: 1.016 (ref 1.002–1.030)
UROBILINOGEN: <2 mg/dL (ref <2.0)

## 2022-08-06 LAB — COMPREHENSIVE METABOLIC PANEL, NON-FASTING
ALBUMIN: 4.5 g/dL (ref 3.5–5.0)
ALKALINE PHOSPHATASE: 71 U/L (ref 40–110)
ALT (SGPT): 10 U/L (ref 8–22)
ANION GAP: 10 mmol/L (ref 4–13)
AST (SGOT): 17 U/L (ref 8–45)
BILIRUBIN TOTAL: 0.5 mg/dL (ref 0.3–1.3)
BUN/CREA RATIO: 13 (ref 6–22)
BUN: 11 mg/dL (ref 8–25)
CALCIUM: 9.4 mg/dL (ref 8.6–10.2)
CHLORIDE: 104 mmol/L (ref 96–111)
CO2 TOTAL: 22 mmol/L (ref 22–30)
CREATININE: 0.86 mg/dL (ref 0.60–1.05)
ESTIMATED GFR - FEMALE: 90 mL/min/BSA (ref >=60)
GLUCOSE: 63 mg/dL — ABNORMAL LOW (ref 65–125)
POTASSIUM: 3.7 mmol/L (ref 3.5–5.1)
PROTEIN TOTAL: 7.6 g/dL (ref 6.4–8.3)
SODIUM: 136 mmol/L (ref 136–145)

## 2022-08-06 LAB — HCG QUALITATIVE PREGNANCY, SERUM: PREGNANCY, SERUM QUALITATIVE: NEGATIVE

## 2022-08-06 LAB — URINALYSIS, MICROSCOPIC: RBCS: >100 /hpf — AB

## 2022-08-06 LAB — SALICYLATE ACID LEVEL: SALICYLATE LEVEL: <5 mg/dL — ABNORMAL LOW (ref 15–30)

## 2022-08-06 LAB — ETHANOL, SERUM/PLASMA
ETHANOL: <10 mg/dL (ref <10)
ETHANOL: NOT DETECTED

## 2022-08-06 LAB — ACETAMINOPHEN LEVEL: ACETAMINOPHEN LEVEL: <5 ug/mL — ABNORMAL LOW (ref 10–30)

## 2022-08-06 LAB — COVID-19 ~~LOC~~ MOLECULAR LAB TESTING
INFLUENZA VIRUS TYPE A: NOT DETECTED
INFLUENZA VIRUS TYPE B: NOT DETECTED
RESPIRATORY SYNCTIAL VIRUS (RSV): NOT DETECTED
SARS-CoV-2: NOT DETECTED

## 2022-08-06 LAB — THYROID STIMULATING HORMONE WITH FREE T4 REFLEX: TSH: 2.121 u[IU]/mL (ref 0.350–4.940)

## 2022-08-06 LAB — LIGHT GREEN TOP TUBE

## 2022-08-06 LAB — RED TOP TUBE

## 2022-08-06 MED ORDER — OXCARBAZEPINE 150 MG TABLET
300.0000 mg | ORAL_TABLET | ORAL | Status: AC
Start: 2022-08-06 — End: 2022-08-06
  Administered 2022-08-06: 300 mg via ORAL
  Filled 2022-08-06: qty 2

## 2022-08-06 MED ORDER — OLANZAPINE 5 MG TABLET
10.0000 mg | ORAL_TABLET | ORAL | Status: AC
Start: 2022-08-06 — End: 2022-08-06
  Administered 2022-08-06: 10 mg via ORAL
  Filled 2022-08-06: qty 2

## 2022-08-06 MED ORDER — CYCLOBENZAPRINE 10 MG TABLET
10.0000 mg | ORAL_TABLET | ORAL | Status: DC
Start: 2022-08-06 — End: 2022-08-06

## 2022-08-06 MED ORDER — CLONAZEPAM 0.5 MG TABLET
0.5000 mg | ORAL_TABLET | ORAL | Status: AC
Start: 2022-08-06 — End: 2022-08-06
  Administered 2022-08-06: 0.5 mg via ORAL
  Filled 2022-08-06: qty 1

## 2022-08-06 MED ORDER — PRIMIDONE 50 MG TABLET
50.0000 mg | ORAL_TABLET | ORAL | Status: AC
Start: 2022-08-06 — End: 2022-08-06
  Administered 2022-08-06: 50 mg via ORAL
  Filled 2022-08-06: qty 1

## 2022-08-06 MED ORDER — HYDROXYZINE PAMOATE 25 MG CAPSULE
50.0000 mg | ORAL_CAPSULE | ORAL | Status: DC
Start: 2022-08-06 — End: 2022-08-06

## 2022-08-06 MED ORDER — GUANFACINE 1 MG TABLET
1.0000 mg | ORAL_TABLET | ORAL | Status: AC
Start: 2022-08-06 — End: 2022-08-06
  Administered 2022-08-06: 1 mg via ORAL
  Filled 2022-08-06: qty 1

## 2022-08-06 MED ORDER — CLONIDINE HCL 0.1 MG TABLET
0.1000 mg | ORAL_TABLET | ORAL | Status: DC
Start: 2022-08-06 — End: 2022-08-06

## 2022-08-06 MED ORDER — QUETIAPINE 25 MG TABLET
200.0000 mg | ORAL_TABLET | ORAL | Status: AC
Start: 2022-08-06 — End: 2022-08-06
  Administered 2022-08-06: 200 mg via ORAL
  Filled 2022-08-06: qty 8

## 2022-08-06 NOTE — ED Provider Notes (Signed)
Emergency Department  Provider Note    Name: [name]      Subjective  History of Present Illness    Jasmine [name] is a 36 y.o. female  who presents to the ED today for psychiatric evaluation  [facility] [place].  Per report patient has been there since December 19th.  She arrived there from a psychiatric facility called [facility].  She has a history of drug abuse, clean for about 70 days per patient report.  Prior use of opioids, methamphetamines, cannabinoid.  She has had increasing bizarre behavior since her arrival.  What prompted her transfer to the ED today was that she had her used tampon in her mouth and was scooting her bottom across the floor.  She has outbursts of anger and cussing.  Other times she seems very hopeful about her sobriety.  She has voiced suicidal thoughts without a plan.  She endorses hopelessness and depression.  She has been verbally abusive with staff but not physically violent.  She has staring episodes.  She has been talking in the 3rd person.  On multiple medications.  Diagnosis of bipolar disorder.  Patient states she thinks about leaving [facility] to be on the streets but states she does not know anybody around here and she knows it would be unsafe.            Historical Data   Below information reviewed with patient where pertinent:  History reviewed. No pertinent past medical history.    No current outpatient medications on file.       Allergies   Allergen Reactions    Darvocet A500 [Propoxyphene N-Acetaminophen]     Latex     Penicillins        No past surgical history on file.    Social History     Tobacco Use    Smoking status: Every Day     Types: Cigarettes    Smokeless tobacco: Never   Substance Use Topics    Alcohol use: Never    Drug use: Yes     Types: Methamphetamines              Objective  Physical Exam   Filed Vitals:    08/06/22 1758 08/06/22 2147   BP: 130/84 121/81   Pulse: 98 (!) 101   Resp: 20 20    Temp: 36.3 C (97.3 F) 36.4 C (97.5 F)   SpO2: 99% 98%       Physical Exam  Vital signs Reviewed.   Constitutional: NAD.   HENT:   Head: Normocephalic and atraumatic   Mouth/Throat: Oropharynx is clear and moist   Eyes: EOMI, conjunctivae without discharge bilaterally  Neck: Trachea midline   Musculoskeletal: No edema, tenderness or deformity  Skin: warm and dry. No rash, erythema, pallor or cyanosis  Psychiatric: Rapid speech, labile mood, episodes of yelling and cussing at herself.  Neurological: Alert&Ox3. Grossly intact         Orders and Results  Patient Data    Labs Ordered/Reviewed   COMPREHENSIVE METABOLIC PANEL, NON-FASTING - Abnormal; Notable for the following components:       Result Value    GLUCOSE 63 (*)     All other components within normal limits   DRUG SCREEN, NO CONFIRMATION, URINE - Abnormal; Notable for the following components:    BARBITURATES URINE Positive (*)     CREATININE RANDOM URINE 105 (*)  All other components within normal limits    Narrative:     Drug screening results are intended for medical use only and are presumptive/unconfirmed. When unexpected results are obtained, consider definitive testing if it has not been ordered.                     ACETAMINOPHEN LEVEL - Abnormal; Notable for the following components:    ACETAMINOPHEN LEVEL  <5 (*)     All other components within normal limits   SALICYLATE ACID LEVEL - Abnormal; Notable for the following components:    SALICYLATE LEVEL <5 (*)     All other components within normal limits   CBC WITH DIFF - Abnormal; Notable for the following components:    NEUTROPHIL % 75 (*)     LYMPHOCYTE % 18 (*)     All other components within normal limits   URINALYSIS, MACROSCOPIC - Abnormal; Notable for the following components:    APPEARANCE Hazy (*)     LEUKOCYTES Small (*)     BLOOD Large (*)     All other components within normal limits   URINALYSIS, MICROSCOPIC - Abnormal; Notable for the following components:    RBCS >100 (*)      WBCS 5-10 (*)     BACTERIA Slight (*)     All other components within normal limits   THYROID STIMULATING HORMONE WITH FREE T4 REFLEX - Normal   COVID-19 [place] [facility] TESTING - Normal    Narrative:     Results are for the simultaneous qualitative identification of SARS-CoV-2 (formerly 2019-nCoV), Influenza A, Influenza B, and RSV RNA. These etiologic agents are generally detectable in nasopharyngeal and nasal swabs during the ACUTE PHASE of infection. Hence, this test is intended to be performed on respiratory specimens collected from individuals with signs and symptoms of upper respiratory tract infection who meet Centers for Disease Control and Prevention (CDC) clinical and/or epidemiological criteria for Coronavirus Disease 2019 (COVID-19) testing. CDC COVID-19 criteria for testing on human specimens is available at [facility]'s webpage information for Healthcare Professionals: Coronavirus Disease 2019 (COVID-19) ([employer]com.auhttps://www.cdc.gov/coronavirus/2019-ncov/hcp/index.html).                                     False-negative results may occur if the virus has genomic mutations, insertions, deletions, or rearrangements or if performed very early in the course of illness. Otherwise, negative results indicate virus specific RNA targets are not detected, however negative results do not preclude SARS-CoV-2 infection/COVID-19, Influenza, or Respiratory syncytial virus infection. Results should not be used as the sole basis for patient management decisions. Negative results must be combined with clinical observations, patient history, and epidemiological information. If upper respiratory tract infection is still suspected based on exposure history together with other clinical findings, re-testing should be considered.                                    Disclaimer:                   This assay has been authorized by FDA under an Emergency Use Authorization for use in laboratories certified under the Clinical Laboratory  Improvement Amendments of 1988 (CLIA), 42 U.S.C. 727 198 2029263a, to perform high complexity tests. The impacts of vaccines, antiviral therapeutics, antibiotics, chemotherapeutic or immunosuppressant drugs have not been evaluated.  Test methodology:                   Cepheid Xpert Xpress SARS-CoV-2/Flu/RSV Assay real-time polymerase chain reaction (RT-PCR) test on the GeneXpert Dx and Xpert Xpress systems.   HCG QUALITATIVE PREGNANCY, SERUM - Normal   URINE CULTURE,ROUTINE   CBC/DIFF    Narrative:     The following orders were created for panel order CBC/DIFF.                  Procedure                               Abnormality         Status                                     ---------                               -----------         ------                                     CBC WITH HYQM[[id number]]                Abnormal            Final result                                                 Please view results for these tests on the individual orders.   URINALYSIS, MACROSCOPIC AND MICROSCOPIC W/CULTURE REFLEX    Narrative:     The following orders were created for panel order URINALYSIS, MACROSCOPIC AND MICROSCOPIC W/CULTURE REFLEX.                  Procedure                               Abnormality         Status                                     ---------                               -----------         ------                                     URINALYSIS, MACROSCOPIC[[id number]]      Abnormal            Final result                               URINALYSIS, MICROSCOPIC[[id number]]      Abnormal  Final result                                                 Please view results for these tests on the individual orders.   ETHANOL, SERUM/PLASMA   EXTRA TUBES - [facility]    Narrative:     The following orders were created for panel order EXTRA TUBES - [facility].                  Procedure                               Abnormality         Status                                      ---------                               -----------         ------                                     LIGHT GREEN TOP JWJX[[id number]]                             In process                                 RED TOP OZHY[[id number]]                                     In process                                                   Please view results for these tests on the individual orders.   LIGHT GREEN TOP TUBE   RED TOP TUBE       Imaging:  No orders to display        Nursing notes, labs, and imaging reviewed.    Orders:  Orders Placed This Encounter    URINE CULTURE,ROUTINE    CBC/DIFF    COMPREHENSIVE METABOLIC PANEL, NON-FASTING    THYROID STIMULATING HORMONE WITH FREE T4 REFLEX    COVID-19 SCREENING- Asymptomatic    URINALYSIS, MACROSCOPIC AND MICROSCOPIC W/CULTURE REFLEX    DRUG SCREEN, NO CONFIRMATION, URINE    HCG QUALITATIVE PREGNANCY, SERUM    ACETAMINOPHEN LEVEL    ETHANOL, SERUM    SALICYLATE ACID LEVEL    CBC WITH DIFF    URINALYSIS, MACROSCOPIC    URINALYSIS, MICROSCOPIC    EXTRA TUBES - [facility]    LIGHT GREEN TOP TUBE    RED TOP TUBE    RESP THERAPY INSTRUCT/EDUCATE    PATIENT CLASS/LEVEL OF CARE DESIGNATION - [facility]  clonazePAM (klonoPIN) tablet    guanFACINE (TENEX) tablet    OXcarbazepine (TRILEPTAL) tablet    primidone (MYSOLINE) tablet    QUEtiapine (SEROquel) tablet    OLANZapine (zyPREXA) tablet           Medical Decision Making   Medical Decision Making  In brief, CYNDE MENARD is a 36 y.o. female who presented to the ED for acute psychosis.       Appears acutely manic with psychotic features.  Medical screening labs unremarkable.  Hematuria due to menses.  Felt patient did not have capacity to make medical decisions and is a danger to herself.  Followed involuntary mental hygiene paperwork which was approved by Marketing executive.  Patient was accepted for admission to the Behavioral Health unit by Dr. [name] Hacker.  Patient was given the home medications that we stock here  +Zyprexa.      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 Decision-making details documented in ED Course.    Risk  Prescription drug management.  Decision regarding hospitalization.          Clinical Impression:     Clinical Impression   Involuntary commitment (Primary)   Psychosis (CMS HCC)       Disposition: Admitted    Follow up:   No follow-up provider specified.             History reviewed in chart.  Parts of this patient's chart were completed in a retrospective fashion due to simultaneous direct patient care activities in the Emergency Department.

## 2022-08-06 NOTE — ED Nurses Note (Signed)
Faxed Involuntary Application to John Artimez [place] [facility].  Fax Confirmation received.

## 2022-08-06 NOTE — ED Nurses Note (Signed)
Called security for pt watch.

## 2022-08-06 NOTE — ED Nurses Note (Signed)
Pt took medications- stated "I'm going to take my damn meds- then i want you to call the place I was at for them to come get me. I do not feel safe here. I want to go back where I was. Bring the DR in here."

## 2022-08-06 NOTE — ED Nurses Note (Signed)
Katelyn Dantzler PA spoke with Dr Ghobrial

## 2022-08-06 NOTE — ED Nurses Note (Signed)
While in the room the pt was very manic with her stories. The pt first started out with when she living in [place] and how she got to [place]. Pt then began to talk about how she was at [facility]. Pt couldn't keep to one topic for very long. When walking to the restroom to provide a urine sample the pt was unable to and became very angry with her self and started cussing at herself. While the room talking the pt began to stare at the wall and wouldn't respond to any questions.

## 2022-08-06 NOTE — ED Nurses Note (Signed)
Report given to Behavioral health.

## 2022-08-06 NOTE — ED Triage Notes (Signed)
Patient sent from psychiatric unit [facility]. Nurse called to have patient sent because she was scooting her butt across the floor sucking on a bloody tampon. Patient upset upon arrival refusing to change into hospital attire.

## 2022-08-06 NOTE — Group Note (Signed)
Group topic:  EDUCATION GROUP    Date of group:  08/06/2022  Start time of group:  2000  End time of group:  2015    Attend: []   Not Attend:  [x]   Attendance:  inpatient attended 0 minutes    Summary of group:  Utilizing exercise as a coping skill              Summary of group discussion:  coping skills      Jasmine [name]  is a 36 y.o. female participating in a education group.    Patient observations:  Pt was not yet admitted to unit      Patient goals:      Wandra Scot, RN  08/06/2022, 22:25

## 2022-08-06 NOTE — Nurses Notes (Signed)
36 year old female admitted on a Jasmine [name] [facility] involuntary commitment through [facility]' ER.  The patient was transported from [facility], they report that the patient was displaying manic behavior, including "scooting her butt across the floor sucking on a bloody tampon."  While in the ER, the patient reported that she did not feel safe in the hospital and wanted to be returned to the SUDS program.  An ER staff member made the hygiene application.    The patient was not cooperative with the admission process.  She described herself as very angry about being in the hospital.  Her speech was rapid and pressured and animated.  She did follow directions from staff.  She claimed that the ER staff told her, she would be in the hospital for one night only.  She did not want to answer questions about her history or the events which led to her being brought to the hospital.  She did speak about being diagnosed with tourette's recently.  She also spoke about her "ying yang" in exaggerated detail and about her frustration of not having sex recently.  "I'm very clean.  I shave everyday, my pits, my legs, my butt hole, my ying yang, everything."    The patient claims to be "70 days clean".  She did not want to talk about her substance abuse or mental health history.

## 2022-08-06 NOTE — ED Nurses Note (Signed)
Pt yelling out "I ain't staying here- I don't feel safe here". Pt appears aggravated. Security remains 1:1 watch

## 2022-08-06 NOTE — ED Nurses Note (Signed)
[facility] called and gave Verbal Hold Order for Involuntary Application.  Will fax Hold Order later this evening.

## 2022-08-07 ENCOUNTER — Encounter (HOSPITAL_COMMUNITY): Payer: Self-pay | Admitting: PSYCHIATRY

## 2022-08-07 DIAGNOSIS — F1590 Other stimulant use, unspecified, uncomplicated: Secondary | ICD-10-CM

## 2022-08-07 DIAGNOSIS — F319 Bipolar disorder, unspecified: Secondary | ICD-10-CM

## 2022-08-07 DIAGNOSIS — Z5900 Homelessness unspecified: Secondary | ICD-10-CM

## 2022-08-07 DIAGNOSIS — R45851 Suicidal ideations: Secondary | ICD-10-CM

## 2022-08-07 DIAGNOSIS — F1190 Opioid use, unspecified, uncomplicated: Secondary | ICD-10-CM

## 2022-08-07 MED ORDER — OLANZAPINE 5 MG TABLET
5.0000 mg | ORAL_TABLET | ORAL | Status: AC
Start: 2022-08-07 — End: 2022-08-07
  Administered 2022-08-07: 5 mg via ORAL
  Filled 2022-08-07: qty 1

## 2022-08-07 MED ORDER — HYDROXYZINE PAMOATE 50 MG CAPSULE
50.0000 mg | ORAL_CAPSULE | Freq: Three times a day (TID) | ORAL | Status: DC | PRN
Start: 2022-08-07 — End: 2022-08-17
  Administered 2022-08-07 – 2022-08-08 (×2): 50 mg via ORAL
  Administered 2022-08-08: 0 mg via ORAL
  Administered 2022-08-08 – 2022-08-16 (×9): 50 mg via ORAL
  Filled 2022-08-07 (×12): qty 1

## 2022-08-07 MED ORDER — PRIMIDONE 50 MG TABLET
50.0000 mg | ORAL_TABLET | Freq: Three times a day (TID) | ORAL | Status: DC
Start: 2022-08-07 — End: 2022-08-17
  Administered 2022-08-07 – 2022-08-17 (×30): 50 mg via ORAL
  Filled 2022-08-07 (×30): qty 1

## 2022-08-07 MED ORDER — PROMETHAZINE 25 MG TABLET
25.0000 mg | ORAL_TABLET | Freq: Four times a day (QID) | ORAL | Status: DC | PRN
Start: 2022-08-07 — End: 2022-08-17

## 2022-08-07 MED ORDER — OXCARBAZEPINE 150 MG TABLET
300.0000 mg | ORAL_TABLET | Freq: Two times a day (BID) | ORAL | Status: DC
Start: 2022-08-07 — End: 2022-08-08
  Administered 2022-08-07 – 2022-08-08 (×3): 300 mg via ORAL
  Filled 2022-08-07 (×3): qty 2

## 2022-08-07 MED ORDER — CLONAZEPAM 0.5 MG TABLET
0.5000 mg | ORAL_TABLET | Freq: Two times a day (BID) | ORAL | Status: DC
Start: 2022-08-07 — End: 2022-08-17
  Administered 2022-08-07 – 2022-08-17 (×21): 0.5 mg via ORAL
  Filled 2022-08-07 (×21): qty 1

## 2022-08-07 MED ORDER — DIPHENHYDRAMINE 25 MG CAPSULE
25.0000 mg | ORAL_CAPSULE | Freq: Four times a day (QID) | ORAL | Status: DC | PRN
Start: 2022-08-07 — End: 2022-08-17
  Administered 2022-08-12: 0 mg via ORAL
  Administered 2022-08-15 – 2022-08-16 (×2): 25 mg via ORAL
  Filled 2022-08-07 (×3): qty 1

## 2022-08-07 MED ORDER — DOCUSATE SODIUM 100 MG CAPSULE
100.0000 mg | ORAL_CAPSULE | Freq: Two times a day (BID) | ORAL | Status: DC | PRN
Start: 2022-08-07 — End: 2022-08-17
  Administered 2022-08-10 – 2022-08-16 (×3): 100 mg via ORAL
  Filled 2022-08-07 (×3): qty 1

## 2022-08-07 MED ORDER — NICOTINE 21 MG/24 HR DAILY TRANSDERMAL PATCH
21.0000 mg | MEDICATED_PATCH | Freq: Every day | TRANSDERMAL | Status: DC
Start: 2022-08-07 — End: 2022-08-17
  Administered 2022-08-07 – 2022-08-17 (×11): 21 mg via TRANSDERMAL
  Filled 2022-08-07 (×11): qty 1

## 2022-08-07 MED ORDER — QUETIAPINE 100 MG TABLET
200.0000 mg | ORAL_TABLET | Freq: Every evening | ORAL | Status: DC
Start: 2022-08-07 — End: 2022-08-08
  Administered 2022-08-07: 200 mg via ORAL
  Filled 2022-08-07: qty 2

## 2022-08-07 MED ORDER — IBUPROFEN 400 MG TABLET
400.0000 mg | ORAL_TABLET | Freq: Four times a day (QID) | ORAL | Status: DC | PRN
Start: 2022-08-07 — End: 2022-08-17
  Administered 2022-08-07 – 2022-08-17 (×19): 400 mg via ORAL
  Filled 2022-08-07 (×19): qty 1

## 2022-08-07 MED ORDER — CLONIDINE HCL 0.1 MG TABLET
0.1000 mg | ORAL_TABLET | Freq: Three times a day (TID) | ORAL | Status: DC | PRN
Start: 2022-08-07 — End: 2022-08-17
  Administered 2022-08-07 – 2022-08-08 (×2): 0.1 mg via ORAL
  Filled 2022-08-07 (×2): qty 1

## 2022-08-07 MED ORDER — ALUMINUM-MAG HYDROXIDE-SIMETHICONE 200 MG-200 MG-20 MG/5 ML ORAL SUSP
5.0000 mL | ORAL | Status: DC | PRN
Start: 2022-08-07 — End: 2022-08-13
  Administered 2022-08-11: 5 mL via ORAL
  Filled 2022-08-07: qty 30

## 2022-08-07 MED ORDER — QUETIAPINE 100 MG TABLET
100.0000 mg | ORAL_TABLET | Freq: Every day | ORAL | Status: DC
Start: 2022-08-07 — End: 2022-08-08
  Administered 2022-08-07 – 2022-08-08 (×2): 100 mg via ORAL
  Filled 2022-08-07 (×3): qty 1

## 2022-08-07 MED ORDER — DIPHENHYDRAMINE 25 MG CAPSULE
25.0000 mg | ORAL_CAPSULE | Freq: Every evening | ORAL | Status: DC | PRN
Start: 2022-08-07 — End: 2022-08-17
  Administered 2022-08-07 – 2022-08-14 (×4): 25 mg via ORAL
  Filled 2022-08-07 (×3): qty 1

## 2022-08-07 NOTE — Behavioral Health (Signed)
Patient is currently in the day area socializing. She's took several naps today. She denies suicidal,homicidal and hallucinations. She's safe for discharge. She was worried about [facility] not taking her back she talked to her mom and the conversation went really well. She's stayed calm all day. Appetite is good.  Ky Barban, MHS

## 2022-08-07 NOTE — Behavioral Health (Signed)
Patient slept most of the night. Only waking a couple times to ask for juice to drink. Staff will continue maintaining safety checks.

## 2022-08-07 NOTE — Group Note (Signed)
Psychoeducational Group Note  Date of group:  08/07/2022  Start time of group:  1015  End time of group:  84    Attend:  []   Not attend:  [x]   Attendance:  inpatient attended 0 minutes               Summary of group discussion: Patients were presented with [name] 12 basic irrational beliefs. They were asked to dispute each irration belief, and explain why they are irrational. Patients shared their own irrational beliefs, and were encouraged to dispute them in the future.       Jasmine [name]  s a 36 y.o. female. Jasmine [name] was prompted, but did not attend this group.    Jasmine [name], [facility] 08/07/2022 11:24
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
## 2022-08-07 NOTE — Group Note (Signed)
Group topic:  RECREATION GROUP    Date of group:  08/07/2022  Start time of group:  1530  End time of group:  [place]    Attend:  []   Not attend: [x]   Attendance:  inpatient attended 0 minutes               Summary of group discussion: This group promoted relaxation, creativity and distraction. Patients used stencils and pearlers to make a design and expressed enjoyment in the activity.       AMAKA GLUTH  is a 36 y.o. female.    Patient's mental status/affect:    Patient's behavior:    Patient's response: [name] did not attend this group.      Harriette Bouillon, RN  08/07/2022, 16:57

## 2022-08-07 NOTE — Care Plan (Addendum)
Jasmine [name] is out on the unit, she is dressed in her own clothing. Hair is on several pony tails and she is pacing the halls. She is restless and in constant movement. States that she is always restless because she has a h/o ADHD. Speech is hyper and pressured. She is labile. Tearful when talking about her kids, states that she has 5 kids "two 36-yr old twins and another 28 year old that was taken from me when he was 38 months old. I haven't raised any of my kids. They all have different dads. Some are with their dads and some are with other families. 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  States "I was in rehab for 70 some days, I've been clean for 2 years. I am fine to go back there. There is no reason why I am here. I like y'all though, everyone is nice." She then follows me to the med room and states "I need to leave, I don't like y'all and I don't feel safe here." [name] states that she was homeless before rehab. She denies SI/HI or hallucinations but appears preoccupied. She is easily agitated. Scars noted to b/l forearms "I use to cut myself when I was around 36 years old ,not to kill myself, I just liked to watch the blood." Contracts for safety on the unit. Will monitor.  Problem: Adult Behavioral Health Plan of Care  Goal: Patient-Specific Goal (Individualization)  Outcome: Ongoing (see interventions/notes)  Flowsheets (Taken 08/07/2022 0840)  Individualized Care Needs: Provide a safe and theraperutic environment  Anxieties, Fears or Concerns: "I am here because I have tourettes."  Patient-Specific Goals (Include Timeframe): Maintain patient safety during her hospital stay  Volanda Napoleon, RN

## 2022-08-07 NOTE — Nurses Notes (Signed)
Jasmine [name] comes out of her room yelling "fuck those faggots, that's why I am here, I don't know why I am defending that fucking faggot. What state are we even in? I am not on enough meds."  Prn Vistaril given,

## 2022-08-07 NOTE — Group Note (Signed)
Group topic:  RECREATION GROUP    Date of group:  08/07/2022  Start time of group:  1120  End time of group:  1200    Attend:  []   Not attend: [x]   Attendance:  inpatient attended 0 minutes               Summary of group discussion: Patients participated in a game of Apples to Apples. The purpose of this activity was to improve patient mood, increase socialization, and promote positive relaxation.       Jasmine [name]  is a 36 y.o. female. [name] was prompted, but did not attend this group.    [name], [facility]  08/07/2022, 12:32

## 2022-08-07 NOTE — Group Note (Signed)
Group topic:  RECREATION GROUP    Date of group:  08/07/2022  Start time of group:  1410  End time of group:  1450    Attend:  []   Not attend: [x]   Attendance:  inpatient attended 0 minutes               Summary of group discussion: Patient did Pictionary      Jasmine [name]  is a 36 y.o. female participating in a recreation group.    Patient's mental status/affect:    Patient's behavior:    Patient's response: Patient didn't participate in group. She was in her room resting.       Ky Barban, M[facility]  08/07/2022, 15:18
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
## 2022-08-07 NOTE — Group Note (Signed)
Group topic:  GOALS/AFFIRMATIONS GROUP    Date of group:  08/07/2022  Start time of group:  0715  End time of group:  0800    Attend: [x]   Not attend: []   Attendance:  inpatient attended all of group               Summary of group discussion: Sleep (poor, fair and good), Depression/Anxiety 1-10 (10 being the worst), Suicidal (Plan contracts for safety on the unit No plan), Homicidal( Denies or Yes and towards who), Hallucinations (Visual what you see or Auditory what they are saying), Contracts safety on the unit, Safe to leave, was their goal completed today.       Jasmine [name]  is a 36 y.o. female participating in a goal group.    Patient's mental status/affect:    Patient's behavior:    Patient's response: Patient reports sleep is "fair" Depression 2, Anxiety 8, Denies suicidal,homicidal and hallucinations. Safe on the unit and safe for discharge. Goal-Be more Patient    [name], MHS  08/07/2022, 09:51

## 2022-08-07 NOTE — Care Plan (Signed)
Pt resting in bed with eyes closed.  Resp quiet and non-labored.  Provided calm and quiet environment conducive to sleep.  Pt appears to have slept well throughout shift.  Will continue to monitor per protocol.  Problem: Adult Behavioral Health Plan of Care  Goal: Patient-Specific Goal (Individualization)  Outcome: Ongoing (see interventions/notes)

## 2022-08-07 NOTE — Behavioral Health (Signed)
Pt met with [facility] via zoom for certification. She stated "I don't want to be here for christmas and if I have to stay here I will flip out and it will be really bad".     Will continue to monitor.     Fransico Michael, MHS

## 2022-08-08 DIAGNOSIS — F17210 Nicotine dependence, cigarettes, uncomplicated: Secondary | ICD-10-CM

## 2022-08-08 DIAGNOSIS — F1911 Other psychoactive substance abuse, in remission: Secondary | ICD-10-CM

## 2022-08-08 DIAGNOSIS — A5901 Trichomonal vulvovaginitis: Secondary | ICD-10-CM

## 2022-08-08 DIAGNOSIS — F17213 Nicotine dependence, cigarettes, with withdrawal: Secondary | ICD-10-CM

## 2022-08-08 DIAGNOSIS — B3731 Acute candidiasis of vulva and vagina: Secondary | ICD-10-CM

## 2022-08-08 MED ORDER — TERCONAZOLE 0.8 % VAGINAL CREAM
1.0000 | TOPICAL_CREAM | Freq: Every evening | VAGINAL | Status: AC
Start: 2022-08-08 — End: 2022-08-10
  Administered 2022-08-08 – 2022-08-10 (×3): 1 via VAGINAL
  Filled 2022-08-08: qty 20

## 2022-08-08 MED ORDER — METRONIDAZOLE 500 MG TABLET
500.0000 mg | ORAL_TABLET | Freq: Two times a day (BID) | ORAL | Status: AC
Start: 2022-08-08 — End: 2022-08-15
  Administered 2022-08-08 – 2022-08-15 (×14): 500 mg via ORAL
  Filled 2022-08-08 (×14): qty 1

## 2022-08-08 MED ORDER — QUETIAPINE 100 MG TABLET
100.0000 mg | ORAL_TABLET | Freq: Every evening | ORAL | Status: DC
Start: 2022-08-08 — End: 2022-08-09
  Administered 2022-08-08: 100 mg via ORAL
  Filled 2022-08-08: qty 1

## 2022-08-08 MED ORDER — OLANZAPINE 10 MG TABLET
10.0000 mg | ORAL_TABLET | Freq: Three times a day (TID) | ORAL | Status: DC | PRN
Start: 2022-08-08 — End: 2022-08-17
  Administered 2022-08-08 – 2022-08-16 (×4): 10 mg via ORAL
  Filled 2022-08-08 (×6): qty 1

## 2022-08-08 MED ORDER — HALOPERIDOL 1 MG TABLET
2.0000 mg | ORAL_TABLET | Freq: Every day | ORAL | Status: DC
Start: 2022-08-08 — End: 2022-08-14
  Administered 2022-08-08 – 2022-08-14 (×7): 2 mg via ORAL
  Filled 2022-08-08 (×7): qty 2

## 2022-08-08 MED ORDER — OLANZAPINE 10 MG TABLET
10.0000 mg | ORAL_TABLET | Freq: Every evening | ORAL | Status: DC
Start: 2022-08-08 — End: 2022-08-17
  Administered 2022-08-08 – 2022-08-16 (×9): 10 mg via ORAL
  Filled 2022-08-08 (×9): qty 1

## 2022-08-08 NOTE — Nurses Notes (Signed)
Probable Cause Order was received and placed in patients chart.

## 2022-08-08 NOTE — Group Note (Signed)
Psychoeducational Group Note  Date of group:  08/08/2022  Start time of group:  1000  End time of group:  37    Attend:  []   Not attend:  [x]   Attendance:  inpatient attended 0 minutes               Summary of group discussion: Patients participated in a game of Therapy Jenga, in which each block contained discussion-provoking questions. The purpose of this activity was to improve patient mood, increase socialization, and encourage patients to engage in self-discovery.      Cherlyn Roberts  s a 36 y.o. female. Gunner was prompted, but did not attend this group.    [name], [facility] 08/08/2022 12:05

## 2022-08-08 NOTE — Group Note (Signed)
Group topic:  RECREATION GROUP    Date of group:  08/08/2022  Start time of group:  1100  End time of group:  1150    Attend:  []   Not attend: [x]   Attendance:  inpatient attended 0 minutes               Summary of group discussion: Patients participated in a game of Heads Up! The purpose of this activity was to improve patient mood and practice effective communication.       WILLOW SHIDLER  is a 36 y.o. female. Ponzo was prompted, but did not attend this group.      [name], [facility]  08/08/2022, 12:33

## 2022-08-08 NOTE — Care Plan (Signed)
Jasmine [name] is out on the unit. She is sitting at a table coloring, remains restless/hyper at times. Speech is hyper, loud and pressured. She states that she slept poorly last night, getting up several times t/o the night. Rates her anxiety and depression a 7/10 each. Denies SI/HI or hallucinations. Her appearance if bizarre- she has several pony tails in her hair. She is unkempt. Her goal is to have a good day and a good attitude. States that she feels safe on the unit and would feel safe for discharge. Will monitor.   Problem: Adult Behavioral Health Plan of Care  Goal: Patient-Specific Goal (Individualization)  Outcome: Ongoing (see interventions/notes)  Flowsheets (Taken 08/08/2022 0720)  Individualized Care Needs: Provide a safe and therapeutic environment  Anxieties, Fears or Concerns: "I am going to have a good attitude today and try not to get so upset."  Patient-Specific Goals (Include Timeframe): "Have a good day and a good attitude."  Volanda Napoleon, RN

## 2022-08-08 NOTE — Nurses Notes (Signed)
Patient in room, laying in bed, resp easy, eyes closed.

## 2022-08-08 NOTE — Group Note (Signed)
Group topic:  EVENING REFLECTION / WRAP UP GROUP    Date of group:  08/08/2022  Start time of group:  0720  End time of group:  0740    Attend: []   Not attend: [x]   Attendance:  inpatient attended 0 minutes               Summary of group discussion:      Jasmine [name]  is a 36 y.o. female participating in a evening reflection group.    Patient's mental status/affect:    Patient's behavior:    Patient's response: did not attend    Shihab States L. Addaleigh Nicholls, MHS  08/08/2022, 19:51

## 2022-08-08 NOTE — Group Note (Signed)
Group topic:  RECREATION GROUP    Date of group:  08/08/2022  Start time of group:  1345  End time of group:  1420    Attend:  []   Not attend: [x]   Attendance:  inpatient attended 0 minutes               Summary of group discussion: Patients made a craft and we discussed activities/hobbies that they enjoy doing at home for stress relief. We also discussed the discharge process.      Jasmine [name]  is a 36 y.o. female.    Patient's mental status/affect:    Patient's behavior:    Patient's response: [name] did not participate in this group.      Harriette Bouillon, RN  08/08/2022, 14:20

## 2022-08-08 NOTE — Care Plan (Signed)
Pt. Is currently resting in bed with eyes closed, respirations regular. Will continue to monitor.   Problem: Adult Behavioral Health Plan of Care  Goal: Patient-Specific Goal (Individualization)  Outcome: Ongoing (see interventions/notes)

## 2022-08-08 NOTE — Nurses Notes (Signed)
[name] refuses breakfast at this time. She walks quickly past the nurses station and states "I hate sidewalk and his wife. I haven't sex and I am pissed off." She returns to her room.

## 2022-08-08 NOTE — Group Note (Signed)
Group topic:  GOALS/AFFIRMATIONS GROUP    Date of group:  08/08/2022  Start time of group:  0715  End time of group:  0815    Attend: [x]   Not attend: []   Attendance:  inpatient attended all of group               Summary of group discussion: Sleep (poor, fair and good), Depression/Anxiety 1-10 (10 being the worst), Suicidal (Plan contracts for safety on the unit No plan), Homicidal( Denies or Yes and towards who), Hallucinations (Visual what you see or Auditory what they are saying), Contracts safety on the unit, Safe to leave, was their goal completed today.        Jasmine [name]  is a 36 y.o. female participating in a goal group.    Patient's mental status/affect: labile    Patient's behavior: manic/elevated    Patient's response: Pt reports sleeping poorly. She denies all. She feel safe on and off the unit. Her goal is to have a good day and attitude.     [name], MHS  08/08/2022, 08:24

## 2022-08-08 NOTE — Behavioral Health (Signed)
Pt has been on and off unit through out the shift. She is very labile. She has been observed out on the unit yelling out provocative obscurities. She has not eaten but does drink a lot of (decaf) coffee. She is polite when speaking with staff. She has not attended groups. Pt is currently napping. Pt is dressed in own clothing. Will continue to monitor.    Fransico Michael, MHS

## 2022-08-08 NOTE — Consults (Signed)
Internal Medicine  Hospitalist Consult H&P        Date of Service: 08/08/2022  Jasmine, [name], 36 y.o. female  Date of Admission:  08/06/2022  Date of Birth:  11-Jul-1986  PCP: Aldean Jewett, MD    Information Obtained from: Patient      Chief Complaint: Vaginal itching       HPI: Jasmine [name] is a 36 y.o., White female who presents with vaginal itching. She was seen and examined on the behavioral health unit.  She states on Tuesday she was diagnosed with Trichomonas and started on oral Flagyl.  She had not been continued on this medication here.  She has purulent drainage and discomfort in the vaginal area.  She denies any other complaints.  Denies any chest pain or shortness of breath.  No difficulty bowels or bladder.  No difficulty with urination no burning urgency nor frequency.  She had been in extended neuro behavioral program in [place].  She is previously been addicted to opiates, methamphetamines and cannabinoids.  She states she has been clean and sober.  She does not drink alcohol.  She does smoke cigarettes.  Patient is currently on her menstrual cycle.  Denies having unprotected sex in the last 3 months.         Past medical history:       Past Medical History:   Diagnosis Date    Epileptic seizure ([facility] [place])     Tourette's               Past surgical history:     none           Medication prior to admission     Medications Prior to Admission       Prescriptions    clonazePAM (KLONOPIN) 0.5 mg Oral Tablet    Take 1 Tablet (0.5 mg total) by mouth Every morning 0.5 mg AM, 0.25 mg PM    clonazePAM (KLONOPIN) 0.5 mg Oral Tablet    Take 0.5 Tablets (0.25 mg total) by mouth Every night 0.5 mg every am and 0.25 mg every HS    cloNIDine HCL (CATAPRES) 0.1 mg Oral Tablet    Take 1 Tablet (0.1 mg total) by mouth Three times a day as needed    cyclobenzaprine (FLEXERIL) 10 mg Oral Tablet    Take 1 Tablet (10 mg total) by mouth Three times a day    guanFACINE (INTUNIV) 1 mg Oral Tablet Sustained Release 24 hr     Take 1 Tablet (1 mg total) by mouth Every evening    hydrOXYzine pamoate (VISTARIL) 50 mg Oral Capsule    Take 1 Capsule (50 mg total) by mouth Three times a day as needed for Anxiety    Non-Formulary/Special Preparation (MEDICATION HELP)    Take 2 mg by mouth Every night Orap 2 mg by mouth every HS    OXcarbazepine (TRILEPTAL) 300 mg Oral Tablet    Take 1 Tablet (300 mg total) by mouth Twice daily    primidone (MYSOLINE) 50 mg Oral Tablet    Take 1 Tablet (50 mg total) by mouth Three times a day    QUEtiapine (SEROQUEL) 100 mg Oral Tablet    Take 1 Tablet (100 mg total) by mouth Every morning 100 mg AM, 200 mg PM    QUEtiapine (SEROQUEL) 200 mg Oral Tablet    Take 1 Tablet (200 mg total) by mouth Every night 100 mg every am and 200 mg qhs  Allergies     Allergies   Allergen Reactions    Darvocet A500 [Propoxyphene N-Acetaminophen]     Latex     Penicillins           Family History    Family Medical History:    None           Social History     reports that she has been smoking cigarettes. She has never used smokeless tobacco. She reports current drug use. Drug: Methamphetamines. She reports that she does not drink alcohol.      ROS:     General: No fever, chills, malaise or weight change  Skin:  No rashes, bruises.  Head/Neck:  No headache, LOC, dizziness, stiffness.  Eyes:  No redness, change of vision.  Ears:  No ringing, no pain.  Nose:  No congestion, epistaxis, sinus pressure.  Throat/Mouth:  No soreness, dysphagia.  Chest/Lungs:  No SOB, pleuritic pain, DOE, cough, sputum, hemoptysis.  CV:  No Chest pain, chest pressure, palpatations,pedal edema.  GI:  No nausea, vomiting, diarrhea, constipation, hematemesis, melena, hematochezia.  GU:  No dysuria, hematuria, flank pain, + discharge.  Musculoskeletal:  No swelling, No weakness.  Neurologic:  No dizziness, seizures, numbness, tingling, weakness, imbalance.  Endocrine:  No heat or cold intolerance, no history of diabetes or thyroid disease  The  pertinent positives and negatives are as detailed above and in HPI, otherwise a full ROS otherwise negative.    Vital signs:    Temperature: 36.4 C (97.6 F) Heart Rate: 76 BP (Non-Invasive): 102/62   Respiratory Rate: 18 SpO2: 97 %       I personally reviewed all vitals    Physical Exam:     General: in no acute distress   Eyes: pupils equal and round, reactive to light and accomodation, conjunctival clear  HENT: head atraumatic and normocephalic, mucous membranes moist  Neck: no JVD, no thyroid enlargement, no lymphadenopathy   Lungs: clear to auscultation bilaterally, no wheeze, rhonchi or rales evident   Cardiovascular: regular rate and rhythm S1 S2, no murmur detected, no friction rub evident    Abdomen: soft, BS x 4 present, non-tender, non-distended  Extremities/vascular: no edema evident, + 2 pulses bilaterally   Skin: skin warm and dry   GU: deferred   Neurologic: CN II-XII grossly intact and no focal deficit  Musculoskeletal:  No gross deformities, strength equal bilaterally  Lymphatics: no lymphadenopathy   Psych: normal affect     Labs:    No results found for any visits on 08/06/22 (from the past 24 hour(s)).    I personally reviewed all labs       Imaging Studies:   No orders to display       I personally reviewed all imaging       DNR Status:  Full Code      Problem List:     Active Hospital Problems    Diagnosis    Primary Problem: Involuntary commitment     Involuntary Commitment: managed by primary services.  Seen and evaluated on the behavioral health unit.      Vaginal Trichomonas/vulvovaginal candidiasis:  Per patient diagnosed at previous facility on 08/03/22.  We will complete her course of Flagyl. Provide topical ketoconazole for probable secondary candidiasis.  If we do not see clinical improvement.  We will consult Gynecology for examination in their office.    Polysubstance abuse:  Per patient currently sober.    Nicotine abuse an active withdrawal:  Nicotine  replacement provided,  encouraged cessation.          I independently of the faculty provider spent a total of (21) minutes in direct/indirect care of this patient including initial evaluation, review of laboratory, radiology, diagnostic studies, review of medical record, order entry and coordination of care.        Leone Payor, NP-C      Patient care was directly discussed with Mrs. Leone Payor, nurse practitioner.  I did not personally examine the patient but I have reviewed all key elements of the history and physical as detailed above, agree with above documentation unless otherwise noted.    Chrissie Noa, DO [place]

## 2022-08-08 NOTE — Nurses Notes (Signed)
Observed pacing and talking to herself in her room; "I'm going to ask the doctor to increase dose of my Klonopin:". Offered patient a Vistaril, in which she was agreeable to take. Vistaril 50 mg given p.o.

## 2022-08-08 NOTE — Care Plan (Signed)
Melby is up on the unit.  Originally she refused to talk to this nurse. Than after going to her room she calmed and came out and apologized. States "I am sad I will not get my Christmas presents at the [facility] unit. My Christmas will be ruined". Explained that this unit will have a gift exchange for all the patients tomorrow.  Patient tears up and says "thank you thank you". Patient denies Si HI or hallucinations however frequently talks to herself and can be labile. She is med compliant. Frequently asks staff for assistance. Talks very pressured.  Will monitor. Rudene Christians, RN  Problem: Adult Behavioral Health Plan of Care  Goal: Patient-Specific Goal (Individualization)  Flowsheets (Taken 08/08/2022 2106)  Anxieties, Fears or Concerns: I apologize for saying I would not talk to you

## 2022-08-08 NOTE — Behavioral Health (Signed)
Pt is currently in bed resting with eyes closed. Pt was out on the unit this evening, pt dressed in own clothing. Loud at times, cussing/talking while pacing hallways and in room. Pt showered and came out with wet clothing to be put in dryer. Pt asked for new clothes and pads. When staff didn't get up right when she asked pt was a little loud about it telling staff "I'm bleeding here like a lot i need pads" staff did get her some. Pt Pt came out to eat around 0230 then went to bed. Pt did sleep some last night. Will continue to monitor.     Geryl Rankins, MHS

## 2022-08-08 NOTE — Behavioral Health (Signed)
BEHAVIOURAL MEDICINE [facility]  [address]  [place] [address]       Name: Jasmine [name] MRN:  [id number]   Date: 08/06/2022 Age: 36 y.o.       History:        Interval History:    The chart is reviewed discussed with the nursing staff and patient is interviewed.  Patient said" I am okay I wanted to have my Klonopin increase" discussed with the patient about the substance abuse issue and patient should not be on a controlled substance, but because the patient was on Klonopin while she was at the [facility] program will continue with the Klonopin.  Patient is sexually preoccupied and discussed the patient with this issue and patient seemed to be deprived of any sexual relationship or intimate relationship and feels very frustrated about it.  Patient also complain of irritation in her vagina and she feels there is this issue should be addressed and will consult the hospitalist.  Patient denies any auditory hallucination nor visual hallucination.  Patient reported sometimes she has been having full conversation with people they are not there sometime.  Patient denies any auditory hallucinations.  Patient denies any visual hallucinations today.  Patient denies any suicidal thoughts nor homicidal thoughts.  Patient is feeling depressed and anxious.  Patient feels the whole world is against her.  Patient reported that she has been diagnosed with Tourette's this year at [facility] and includes cussing and that shaking and abnormal movement and she feels very embarrassed by.  Patient will start on Haldol to work on her Tourette's syndrome      Review of systems:   Constitutional: Denies any medication side effect.  GI: Denies any diarrhea, constipation.      Current Facility-Administered Medications   Medication Dose Route Frequency Provider Last Rate Last Admin    aluminum-magnesium hydroxide-simethicone (MAG-AL PLUS) 200-200-20 mg per 5 mL oral liquid  5 mL Oral Q4H PRN Denny Levy, MD         clonazePAM (klonoPIN) tablet  0.5 mg Oral 2x/day Denny Levy, MD   0.5 mg at 08/08/22 0827    cloNIDine (CATAPRES) tablet  0.1 mg Oral 3x/day PRN Denny Levy, MD   0.1 mg at 08/08/22 0543    diphenhydrAMINE (BENADRYL) capsule  25 mg Oral Q6H PRN Denny Levy, MD        diphenhydrAMINE (BENADRYL) capsule  25 mg Oral HS PRN - MR x 1 Denny Levy, MD   25 mg at 08/07/22 2058    docusate sodium (COLACE) capsule  100 mg Oral 2x/day PRN Denny Levy, MD        hydrOXYzine pamoate (VISTARIL) capsule  50 mg Oral 3x/day PRN Denny Levy, MD   50 mg at 08/08/22 0925    ibuprofen (MOTRIN) tablet  400 mg Oral Q6H PRN Denny Levy, MD   400 mg at 08/08/22 0543    nicotine (NICODERM CQ) transdermal patch (mg/24 hr)  21 mg Transdermal Daily Denny Levy, MD   21 mg at 08/08/22 0827    OLANZapine (zyPREXA) tablet  10 mg Oral 3x/day PRN Denny Levy, MD        primidone (MYSOLINE) tablet  50 mg Oral 3x/day Denny Levy, MD   50 mg at 08/08/22 0827    promethazine (PHENERGAN) tablet  25 mg Oral Q6H PRN Denny Levy, MD        QUEtiapine (SEROquel) tablet  100 mg Oral Daily Denny Levy, MD   100  mg at 08/08/22 0827    QUEtiapine (SEROquel) tablet  200 mg Oral NIGHTLY Denny Levy, MD   200 mg at 08/07/22 2058        Allergies:   Allergies   Allergen Reactions    Darvocet A500 [Propoxyphene N-Acetaminophen]     Latex     Penicillins          Exam:      BP 102/62   Pulse 76   Temp 36.4 C (97.6 F)   Resp 18   Ht 1.651 m (5\' 5" )   Wt 63.5 kg (140 lb)   LMP 08/06/2022   SpO2 97%   BMI 23.30 kg/m       General appearance:  Well-developed female, in home clothes, adequately, has a good eye contact, well-nourished, in her stated age.  Behavior:  No psychomotor agitation or tardive dyskinesia  Musculoskeletal:  Normal gait and muscle strength  Speech:  Pressured speech and loud  Affect:  Anxious   Mood:  Irritable  Thought process:  Normal  Association:  Paranoid delusional  Abnormal  thought:  Denies suicidal thoughts nor homicidal thoughts.  Has no auditory hallucinations nor visual hallucinate  Judgement and Insight:  Fair  Attention and concentration:  Fair   Language: Normal       ICD-10-CM    1. Involuntary commitment  Z04.6       2. Psychosis (CMS HCC)  F29            INTERVENTION: Patient continues to require an extensive level of medical decision making due to complexity and acuity of illness and meets criteria for inpatient hospitalization. Discussed status, reviewed option for therapy. Discussed medicine, psychotherapy and structure/schedule of the program. Reviewed the plan, goal, rationale, risks, benefits and alternates. Patient indicated understanding and agreement.      Plan:   Continue admission to inpatient.  Patient still psychotic.  Consult medicine  Medications:  Started Zyprexa 10 mg t.i.d. p.r.n. for increased psychosis with agitation.  Haldol to mg daily for Tourette's.  Reduce the dose of Seroquel to 100 mg q.h.s. and started Zyprexa 10 mg q.h.s.  Case management following for disposition planning  ELOS:  10 days    I spent over 30 minutes on this case of which 65% of the time spent with the patient and over 50% of the time spent in counseling regarding health issue, medication Education, substance abuse and supportive psychotherapy    CPT code:  08/08/2022  34196, MD        This note was partially created using voice recognition software and is inherently subject to errors including those of syntax and "sound-alike" substitutions which may escape proofreading.  In such instances, original meaning may be extrapolated by contextual derivation.

## 2022-08-08 NOTE — Nurses Notes (Signed)
Patient went to room and was yelling "my ying yang is dry. I have no one to lick it or finger it and I am pissed off. I am sick of this shit. I haven't even had my own pussy for 25 years and I am sick of it being everyone elses. They need to given me mine back."  "I am sorry, I am frustrated, I can't take it anymore, it itches and that's not good either."

## 2022-08-09 MED ORDER — QUETIAPINE 100 MG TABLET
200.0000 mg | ORAL_TABLET | Freq: Every evening | ORAL | Status: DC
Start: 2022-08-09 — End: 2022-08-10
  Administered 2022-08-09: 200 mg via ORAL
  Filled 2022-08-09: qty 2

## 2022-08-09 NOTE — Care Plan (Signed)
Patient has been out on the unit most of the shift. She reports that she came to the area about 13 weeks ago and got into drugs. Checked herself in to rehab. She was using crack cocaine and methamphetamine. She reports not sleeping the best last night, due to nightmares. She has been very restless this shift and can not sit still for extended periods of time. She has been pleasant with staff this shift. She had not been making any sexually inappropriate comments, but early this afternoon announced that she is mad because she has not had sex in a year. She has been social with select peers, but nothing seems to have been inappropriate and no other patients have complained to staff about this. She denies any suicidal or homicidal thoughts. She denies hallucinations, but has been heard talking to self in room at times. Her speech is rambling and pressured. Has been compliant with medications. Will continue to monitor.   Problem: Adult Behavioral Health Plan of Care  Goal: Patient-Specific Goal (Individualization)  Outcome: Ongoing (see interventions/notes)     Problem: Psychotic Signs/Symptoms  Goal: Improved Behavioral Control (Psychotic Signs/Symptoms)  Outcome: Ongoing (see interventions/notes)

## 2022-08-09 NOTE — Behavioral Health (Signed)
PROGRESS NOTE        Telemedicine Documentation: This is a telemedicine note. Patient was treated using telemedicine, real time audio and video  Patient Location: [facility]  Provider Location: [facility]  Patient/family aware of provider location: yes  Patient/family consent for telemedicine: yes  Examination observed and performed by: Aram Beecham provider: Leonette Nutting, MD  -------------------------------------------------------------------------------------------------------  Hospital Day: 4  Start Time: 13:23     CHIEF COMPLAINT:  Inpatient daily psychiatric reevaluation    INTERVAL HISTORY:   Staff reports that she is restless, sexually inappropriate yesterday and still today, reporting "its unfair that I haven't had sex in a year."  Sleep poor, up 4 times last night.  Reports nightmares at night.    Appetite ok  Affect irritable, anxious  Side effects denies.  Bizzare behaviors was talking to self yesterday  Hallucinatory behaviors none noted today      On talking to patient: Patient states that Mood "very sad that I'm not in [place] with my family."    Anxiety reports.    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      Denies paranoia or delusions.   Affect is expansive, anxious.  Denies headaches, nausea/ diarrhea/ constipation. Does not report lightheadedness/ palpitations or postural unsteadiness.   EPS      CURRENT PSYCHIATRIC REVIEW OF SYMPTOMS:  Auditory Hallucinations:  NO  Visual Hallucinations: NO  Paranoid ideation: NO    Homicidal Ideation: NO  Suicidal Ideation: NO    Abnormal and persistent elevated or expansive mood: NO  Abnormal and persistent irritable mood? YES  Increased Goal Directed Activity/Psychomotor agitation?  YES  Decreased need for sleep?  POSSIBLE  Excessive or rapid speech?  NO  Racing thoughts?  NO  Distractibility?  NO  Reckless behavior history?  NO      SOCIAL HISTORY:  Reviewed; no new information provided today. Please see the Admission note and Social Work  documentation.      ALLERGIES   Allergies   Allergen Reactions    Darvocet A500 [Propoxyphene N-Acetaminophen]     Latex     Penicillins           MEDICAL REVIEW OF SYSTEMS  11 ROS reviewed, negative or normal or has otherwise specified in subjective or HPI      VITALS  Blood pressure 108/69, pulse 94, temperature 36.6 C (97.9 F), resp. rate 16, height 1.651 m (5\' 5" ), weight 63.5 kg (140 lb), last menstrual period 08/06/2022, SpO2 99%.    LABS  No results found for this or any previous visit (from the past 24 hour(s)).    SCHEDULED MEDS:    clonazePAM (klonoPIN) tablet 0.5 mg 2x/day    haloperidol (HALDOL) tablet 2 mg Daily    metroNIDAZOLE (FLAGYL) tablet 500 mg 2x/day    nicotine (NICODERM CQ) transdermal patch (mg/24 hr) 21 mg Daily    OLANZapine (zyPREXA) tablet 10 mg NIGHTLY    primidone (MYSOLINE) tablet 50 mg 3x/day    QUEtiapine (SEROquel) tablet 100 mg NIGHTLY    terconazole (TERAZOL 3) 0.8% vaginal cream 1 Applicator NIGHTLY      MENTAL STATUS EXAMINATION  Appearance:   appears stated age in street clothes  Level of Consciousness:  Alert   Behavior: Pleasant and Cooperative  Gait: Not tested  Psychomotor:  No agitation or retardation  Speech:  normal rate, tone, volume, and amount  Thought Process:  linear, goal directed  Thought Content:   no suicidal or homicidal  ideation   Affect:  elevated and expansive   Mood:  anxious  Orientation:  O x 3  Attention:  within normal limits  Concentration:  within normal limits  Memory:  unremarkable per conversation  Language:  fluent  Fund of Knowledge/Intellect:  adequate  Abstraction: intact  Insight: intact  Judgment:  intact  Impulse Control:  intact    ----------------------------------------------------------------------------------  -------------------------------------------------------------------------------------  ASSESSMENT: Jasmine [name] is a 36 y.o. female who was admitted with concerns for mania.  She reports the Seroquel has not been helpful.    She has been sexually inappropriate on the unit.      Denied SI/HI, still quite agitated and expansive.            DIAGNOSIS:  Unspecified psychotic d/o  R/o bipolar with psychosis      PLAN:  MEDICATIONS:   - increase Seroquel ot 200 mg po q hs.    Continue admission to inpatient    INTERVENTION: Patient continues to require an extensive level of medical decision making due to complexity and acuity of illness and meets criteria for inpatient hospitalization. Discussed status, reviewed option for therapy. Discussed medicine, psychotherapy and structure/schedule of the program. Reviewed the plan, goal, rationale, risks, benefits and alternates. Patient indicated understanding and agreement.      ADDITIONAL RECOMMENDATIONS:   - Case management following for disposition planning      ELOS: 7-9 days      -------------------------------------------------------------------------------------      CPT Code:  [id number] Level 2 Hospital Progress Note (if by time, at least 35 min)      Vilinda Flake, MD   _______________________________

## 2022-08-09 NOTE — Care Plan (Signed)
Tamburri was up on the unit.  Started talking loudly about being anxious and stormed back to her room to "try and settle myself". She then came back out and apologized for her behavior stating "that damn Tourettes I'll be ok one minute then yelling and cussing it's awful". Arreaga goes on to be hyperexcited about the rehab center calling her and telling her her Christmas presents were at the rehab and they missed her.  States "I was so releaved that they want me back". "Dr. York Spaniel I'll be here 5-7 days so I can do that".  Speech is pressured. She has exaggerated movements that are quick and jerky. She thanks [place] for her presents.  Denies SI HI or hallucinations. Med compliant.  Will monitor. Rudene Christians, RN   Problem: Adult Behavioral Health Plan of Care  Goal: Patient-Specific Goal (Individualization)  Outcome: Ongoing (see interventions/notes)  Flowsheets (Taken 08/09/2022 1932)  Anxieties, Fears or Concerns: I apologize for going off

## 2022-08-09 NOTE — Group Note (Signed)
Group topic:  EVENING REFLECTION / WRAP UP GROUP    Date of group:  08/09/2022  Start time of group:  0730  End time of group:  0810    Attend: []   Not attend: [x]   Attendance:  inpatient attended 0 minutes               Summary of group discussion:      Jasmine [name]  is a 36 y.o. female participating in a evening reflection group.    Patient's mental status/affect:    Patient's behavior:    Patient's response: did not attend    Amelia Burgard L. Greg Eckrich, MHS  08/09/2022, 20:52

## 2022-08-09 NOTE — Nurses Notes (Signed)
Patient is awake and drinking coffee.  Spills creamer and sugar on table.  Movements are erratic. Does clean up her mess after it is made.  Apologizes over and over. Asks for colored pencils to color.  Speech remains pressured. Burns Spain, RN

## 2022-08-09 NOTE — Group Note (Signed)
Group topic:  GOALS/AFFIRMATIONS GROUP    Date of group:  08/09/2022  Start time of group:  0730  End time of group:  0800    Attend: [x]   Not attend: []   Attendance:  inpatient attended all of group               Summary of group discussion:   Patients were asked about sleep. Dep/anx, si/hi/avh, safety and goal for the day. Following are their responses:      Jasmine [name]  is a 36 y.o. female participating in a goal group.    Patient's mental status/affect:  appropriate / appropriate    Patient's behavior:  pleasant and polite smiling social with staff      Patient's response:  Pt slept "OK" rates dep/anx = 8/10 denies si/hi/avh and contracts  Goal is to be patient    [name]. Adrin Julian, MHS  08/09/2022, 08:08

## 2022-08-09 NOTE — Behavioral Health (Signed)
Pt has been out on the unit majority of the shift calm cooperative and social with staff and peers. Eats and sleeps well. Will continue to monitor.

## 2022-08-09 NOTE — Nurses Notes (Signed)
Patient requested and received prn vistaril for complaints of anxiety.

## 2022-08-09 NOTE — Behavioral Health (Signed)
Patient is currently resting in bed. Patient has been up a few times throughout the night for a drink or chips. Patient came out at one point stating "I can't sleep. Everything keeps waking me up and I don't know why, its Christmas and I have nothing to wake up to.   Allex Madia Visual merchandiser, MHS

## 2022-08-09 NOTE — Nurses Notes (Signed)
Patient requested items to shower this morning. When staff knocked on patients door to give her supplies, she stated "come in." Patient was void of all clothing rearranging her shelf. She was reminded to not come out on the unit without clothes on.

## 2022-08-09 NOTE — Nurses Notes (Signed)
Patient requested and received prn Zyprexa for complaints of anxiety and agitation.

## 2022-08-10 MED ORDER — QUETIAPINE 100 MG TABLET
300.0000 mg | ORAL_TABLET | Freq: Every evening | ORAL | Status: DC
Start: 2022-08-10 — End: 2022-08-13
  Administered 2022-08-10 – 2022-08-12 (×3): 300 mg via ORAL
  Filled 2022-08-10 (×3): qty 3

## 2022-08-10 NOTE — Care Plan (Signed)
"  I've been feeling sad, so that is why I've been in my room.  I've been pouting."  The patient spent much of the evening in her room.  She was polite and appropriated in all of her interactions.  No inappropriate body or sexual references.    She appeared to sleep the majority of the night.          Problem: Adult Behavioral Health Plan of Care  Goal: Strengths and Vulnerabilities  Outcome: Ongoing (see interventions/notes)  Goal: Adheres to Safety Considerations for Self and Others  Outcome: Ongoing (see interventions/notes)

## 2022-08-10 NOTE — Care Management Notes (Signed)
Pt attended treatment team. Pt requested to be discharged. Pt stated, "My rehab friends miss me". Pt reports she slept well: however, nursing documented the pt was up 4x throughout the night. Pt had rapid speech. Pt reports "a [place] is in a cage with me. He wants to sleep with me and I don't like him like that". Doctor is increasing Seroquel. Pt is hopeful to return to [facility]. Pt had no further questions or concerns.         Discharge: unknown   Follow Up: [facility]        Sheriden Archibeque Thomas Hoff, MSW

## 2022-08-10 NOTE — Nurses Notes (Signed)
Patient is currently sleeping but has been up through the night 4 times.  At times she was arguing with herself and she was very loud and her movements continue to be jerky and fast. Burns Spain, RN

## 2022-08-10 NOTE — Group Note (Signed)
Group topic:  RECREATION GROUP    Date of group:  08/10/2022  Start time of group:  1000  End time of group:  1100    Attend:  [x]   Not attend: []   Attendance:  inpatient attended 15 minutes               Summary of group discussion: Pts participated in discussion about healthy and unhealthy coping skills. Utilized the group time to list our own favorite healthy coping skills for reference in times of stress. Provided pts with a list of 99 coping skills to explore a variety of options.       Jasmine [name]  is a 36 y.o. female participating in a recreation group.    Patient's mental status/affect: elevated, pleasant    Patient's behavior: energetic, talkative    Patient's response: Pt participated in discussion but excused herself prior to completing task. Pt was having difficulty sitting still during group.       [name], Recreation Therapist  08/10/2022, 11:11

## 2022-08-10 NOTE — Behavioral Health (Signed)
Pt has been on and off the unit throughout shift. She ate breakfast but did not eat lunch. She moves quickly with pressured speech. She has attended groups. She did have an outburst on the unit today after seeing the doctor but then came out to apologize to staff for behavior. Will continue to monitor.    Fransico Michael, MHS

## 2022-08-10 NOTE — Nurses Notes (Signed)
Patient came out and walked over to the garbage can and threw trash in the can making a loud thud.  Arguing with herself. No regard to a sleeping patient in the day area.  Requested patient to not make a lot of noise. States "well I do not need to be a caged animal here". Will monitor. Burns Spain, RN

## 2022-08-10 NOTE — Nurses Notes (Signed)
Pt requested and received Motrin 400mg  for "extreme" cramping.

## 2022-08-10 NOTE — Behavioral Health (Signed)
Name: Jasmine [name]    Sex: Female    Age: 36    City of Residence: [place], but currently attending rehab in [place], [place]    Highest Grade in School: high school diploma with honors    List any foods you can't eat because of allergic reactions: none    List any physical problems you have that keep you from doing activities: none    List typical things you do during your free time: write poetry, coloring    Do you do activities with your family? yes If so, what? Talk with mom, shop, TV, meals    Do you belong to any clubs, groups, teams, organizations? Please list: "I really like the groups here!"    Do you ever feel bored? I get bored very easily, my tourette's and ADHD makes it very difficult to sit still, and I find it embarrassing, but I try to stay busy    Do you use drugs or alcohol? no If yes, how often and what type? Clean 70 days, drug of choice previously was meth and crack    Do you engage is any high risk activities (such as playing chicken with cars/trains, jumping from high places, or any other activity that endangers your life or the life of others ? no If yes, what type? None since sobriety    Do you exercise regularly? no If yes, what type? Used to do sit ups regularly    Are you satisfied with your current social life?  no If no, what would you improve? Limited support    Do you have friends to do things with?  yes    Are you satisfied with your current recreational activities ?  no    Are there enough things to do for fun in your community and in your home? no    Name one activity that helps you feel good about yourself: staying organized and keeping good hygiene    On a scale of 1 to 100, with 100 being very stressed, how stressed are you typically? 50    Name one activity that helps you reduce stress:  walking and talking to my mom.

## 2022-08-10 NOTE — Group Note (Signed)
Psychoeducational Group Note  Date of group:  08/10/2022  Start time of group:  0900  End time of group:  0950    Attend:  [x]   Not attend:  []   Attendance:  inpatient attended all of group               Summary of group discussion: Patients were guided in a discussion about perspective, and how there are many different ways to view someone or something. The group discussed that importance of accepting that some people may have different perspectives and opinions than , and that there is nothing wrong with that. Patients were read many different proverbs and quotes, and ask to share what each quote meant to them, as well as listen to the different interpretations from peers.       s a 36 y.o. female participating in a psychoeducational group.    Affect/Mood:  Appropriate    Thought Process:  Logical    Thought Content:  Within normal limits    Interpersonal:  Discussed issues, Attentive, Displayed insight, and Provided feedback    Level of participation:  Full    Comments: [place] actively participated in this group discussion. She shared her insights and perspectives on the various quotes read aloud to the group, and was attentive as other patients shared their own. Kinter acknowledged that being open to the perspectives of others can open [name] up to new ways of thinking, and help [name] be more empathetic.     [place], [facility] 08/10/2022 10:10

## 2022-08-10 NOTE — Nurses Notes (Signed)
Pt has been out on unit entire first part of shift (7a-11a).  Affect bright.  Mood manic.  Bhs labile but cooperative.  Pt denies suicidal and/or homicidal ideation.  Denies AVOT hallucinations.  Pt continues to display pressured speech and random thoughts.  Was yelling and screaming on unit after treatment team d/t pt wanting discharged today.  Pt is less sexually preoccupied.  Medication compliant and has required PRN Vistaril and Motrin.  Reports that she is safe on the unit as well as safe for discharge at this time.  Will continue to monitor per protocol.

## 2022-08-10 NOTE — Behavioral Health (Signed)
Patient currently resting in bed. Patient woke up a few times throughout the night to eat fruit and went back to sleep. Patient was observed screaming in her sleep once throughout the night  Wynelle Bourgeois, MHS

## 2022-08-10 NOTE — Group Note (Signed)
Group topic:  EDUCATION GROUP    Date of group:  08/10/2022  Start time of group:  1400  End time of group:  1520    Attend: [x]   Not Attend:  []   Attendance:  inpatient attended all of group    Summary of group:               Summary of group discussion:   Patients were led in a group discussion on change vs. Choice and how it affects all aspects of life. Talked about stopping to think before making an impulsive decision, Accepting or not accepting gifts of anger, and Motivation/Drive. Questions were asked and answered. Demonstrations and examples were provided      Jasmine [name]  is a 36 y.o. female participating in a education group.    Patient observations:   Pt attentive throughout offering opinions and comments on topics discussed      Patient goals:   To learn how changing and choosing affect lives, gain some motivation and to make thought out decisions      [name]. Eyoel Throgmorton, MHS  08/10/2022, 15:51

## 2022-08-10 NOTE — Group Note (Signed)
Group topic:  RECREATION GROUP    Date of group:  08/10/2022  Start time of group:  1300  End time of group:  1400    Attend:  [x]   Not attend: []   Attendance:  inpatient attended 20 minutes               Summary of group discussion: Pts participated in cards, reading, word puzzles, and music listening to promote positive socialization and relaxation.       Jasmine [name]  is a 36 y.o. female participating in a recreation group.    Patient's mental status/affect: elevated    Patient's behavior: restless    Patient's response: PT participated in writing poetry and music listening. Pt was unable to remain seated and was noted to stop and stare at her reflection for several minutes. Pt excused herself to work on her poetry and did not return.       [name], Recreation Therapist  08/10/2022, 14:08

## 2022-08-10 NOTE — Group Note (Signed)
Group topic:  EVENING REFLECTION / WRAP UP GROUP    Date of group:  08/10/2022  Start time of group:  1945  End time of group:  1955    Attend: [x]   Not attend: []   Attendance:  inpatient attended all of group               Summary of group discussion: End of the Day Wrap Up Group      Jasmine [name]  is a 36 y.o. female participating in a evening reflection group.    Patient's mental status/affect: patient was calm and pleasant when approached by staff    Patient's behavior: patient was calm and pleasant while answering questions    Patient's response: patient rates their depression 10/10.  Patient reports crying most of the day due to missing their family and wanting to go back to the rehab. Patient rates their anxiety 10/10. Patient reports feeling on the verge of a panic attack. Patient denies SI/HI/AVH. Patient reports feeling safe on the unit and safe to leave.    [place], 12/10  08/10/2022, [phone]

## 2022-08-10 NOTE — Behavioral Health (Signed)
PROGRESS NOTE        Telemedicine Documentation: This is a telemedicine note. Patient was treated using telemedicine, real time audio and video  Patient Location: [facility]  Provider Location: [facility]  Patient/family aware of provider location: yes  Patient/family consent for telemedicine: yes  Examination observed and performed by: Aram Beecham provider: Leonette Nutting, MD  -------------------------------------------------------------------------------------------------------  Hospital Day: 5  Start Time: 10:17     CHIEF COMPLAINT:  Inpatient daily psychiatric reevaluation    INTERVAL HISTORY:   Staff reports that she is less sexually preoccupied.  Going to groups.    Sleep better, woke up an ate, was screaming once at night.  Appetite good  Affect intact.    Side effects  Bizzare behaviors yelling at night  Hallucinatory behaviors     She repots a detective from [place] that has the need to get their murder case solved.  She reports that this detective wants to sleep with her.     Left room yelling and paranoid.        On talking to patient: Patient states that Mood "perfect"  Anxiety repost    Reports paranoia and delusions.   Affect is irritable.  Denies headaches, nausea/ diarrhea/ constipation. Does not report lightheadedness/ palpitations or postural unsteadiness.   EPS      CURRENT PSYCHIATRIC REVIEW OF SYMPTOMS:  Auditory Hallucinations:  NO  Visual Hallucinations: NO  Paranoid ideation: NO    Homicidal Ideation: NO  Suicidal Ideation: NO    Abnormal and persistent elevated or expansive mood: NO  Abnormal and persistent irritable mood? NO  Increased Goal Directed Activity/Psychomotor agitation?  NO  Decreased need for sleep?  NO  Excessive or rapid speech?  NO  Racing thoughts?  NO  Distractibility?  NO  Reckless behavior history?  NO      SOCIAL HISTORY:  Reviewed; no new information provided today. Please see the Admission note and Social Work documentation.      ALLERGIES   Allergies    Allergen Reactions    Darvocet A500 [Propoxyphene N-Acetaminophen]     Latex     Penicillins           MEDICAL REVIEW OF SYSTEMS  11 ROS reviewed, negative or normal or has otherwise specified in subjective or HPI      VITALS  Blood pressure (!) 99/57, pulse 84, temperature 36.6 C (97.9 F), resp. rate 18, height 1.651 m (5\' 5" ), weight 63.5 kg (140 lb), last menstrual period 08/06/2022, SpO2 99%.    LABS  No results found for this or any previous visit (from the past 24 hour(s)).    SCHEDULED MEDS:    clonazePAM (klonoPIN) tablet 0.5 mg 2x/day    haloperidol (HALDOL) tablet 2 mg Daily    metroNIDAZOLE (FLAGYL) tablet 500 mg 2x/day    nicotine (NICODERM CQ) transdermal patch (mg/24 hr) 21 mg Daily    OLANZapine (zyPREXA) tablet 10 mg NIGHTLY    primidone (MYSOLINE) tablet 50 mg 3x/day    QUEtiapine (SEROquel) tablet 200 mg NIGHTLY    terconazole (TERAZOL 3) 0.8% vaginal cream 1 Applicator NIGHTLY      MENTAL STATUS EXAMINATION  Appearance:   appears stated age  Level of Consciousness:  Alert   Behavior: Partially cooperative  Gait: Not tested  Psychomotor:  No agitation or retardation  Speech:  normal rate, tone, volume, and amount  Thought Process:  linear, goal directed  Thought Content:   no suicidal or homicidal ideation   Affect:  irritable and labile  Mood:  irritable  Orientation:  O x 3  Attention:  within normal limits  Concentration:  within normal limits  Memory:  unremarkable per conversation  Language:  fluent  Fund of Knowledge/Intellect:  adequate  Abstraction: intact  Insight: poor  Judgment:  poor  Impulse Control:  poor      -------------------------------------------------------------------------------------  ASSESSMENT: Jasmine [name] is a 36 y.o. female who was admitted with concerns for mania.  She reports the Seroquel has not been helpful.   She has been sexually inappropriate on the unit.       Denied SI/HI, still quite agitated and expansive.        12/26 -  continues to be agitated and  sexually preoccupied, minimally better sleep.  Still delusional.          DIAGNOSIS:  Unspecified psychotic d/o  R/o bipolar with psychosis         PLAN:  MEDICATIONS:   Increase Seroquel to 300 mg po q hs.      Continue admission to inpatient    INTERVENTION: Patient continues to require an extensive level of medical decision making due to complexity and acuity of illness and meets criteria for inpatient hospitalization. Discussed status, reviewed option for therapy. Discussed medicine, psychotherapy and structure/schedule of the program. Reviewed the plan, goal, rationale, risks, benefits and alternates. Patient indicated understanding and agreement.      ADDITIONAL RECOMMENDATIONS:   - Case management following for disposition planning      ELOS: 6-8      -------------------------------------------------------------------------------------      CPT Code:  69629 Level 2 Hospital Progress Note (if by time, at least 35 min)      Leonette Nutting, MD   _______________________________

## 2022-08-10 NOTE — Group Note (Signed)
Group topic:  GOALS/AFFIRMATIONS GROUP    Date of group:  08/10/2022  Start time of group:  0715  End time of group:  0740    Attend: [x]   Not attend: []   Attendance:  inpatient attended all of group               Summary of group discussion:  Sleep (poor, fair and good), Depression/Anxiety 1-10 (10 being the worst), Suicidal (Plan contracts for safety on the unit No plan), Homicidal( Denies or Yes and towards who), Hallucinations (Visual what you see or Auditory what they are saying), Contracts safety on the unit, Safe to leave, was their goal completed today.       Jasmine [name]  is a 36 y.o. female participating in a goal group.    Patient's mental status/affect: bright then tearful    Patient's behavior: labile    Patient's response: Pt reports sleeping good. She denies depression but rates anxiety 7/10. She denies SI/HI/AVI. She feels safe on the unit and safe to leave. Her goal is to accept things as they are.     Hubbert Landrigan [name], MHS  08/10/2022, 07:42

## 2022-08-11 LAB — URINE CULTURE,ROUTINE: URINE CULTURE: 15000 — AB

## 2022-08-11 MED ORDER — MAGNESIUM HYDROXIDE 400 MG/5 ML ORAL SUSPENSION
30.0000 mL | Freq: Two times a day (BID) | ORAL | Status: DC | PRN
Start: 2022-08-11 — End: 2022-08-13
  Administered 2022-08-11 – 2022-08-12 (×2): 2400 mg via ORAL
  Filled 2022-08-11 (×2): qty 30

## 2022-08-11 MED ORDER — POLYETHYLENE GLYCOL 3350 17 GRAM ORAL POWDER PACKET
17.0000 g | Freq: Every day | ORAL | Status: DC
Start: 2022-08-11 — End: 2022-08-17
  Administered 2022-08-11 – 2022-08-17 (×7): 17 g via ORAL
  Filled 2022-08-11 (×7): qty 1

## 2022-08-11 NOTE — Care Plan (Signed)
Pt met with doctor and mood and behaviors were discussed.  Pt states that she feels her medications are working and that she is ready to go back to "rehab" ([facility]).  Pt has pressured speech but it seems to be less so than previous shifts.  Pt is energetically optimistic about being discharged by Friday.  Pt denies suicidal and/or homicidal ideation.  Denies AVOT hallucinations.  Reports that she is safe on the unit as well as safe for discharge.  Will continue to monitor per protocol.  Problem: Adult Behavioral Health Plan of Care  Goal: Patient-Specific Goal (Individualization)  Outcome: Ongoing (see interventions/notes)

## 2022-08-11 NOTE — Behavioral Health (Signed)
PROGRESS NOTE        Telemedicine Documentation: This is a telemedicine note. Patient was treated using telemedicine, real time audio and video  Patient Location: [facility]  Provider Location: [facility]  Patient/family aware of provider location: yes  Patient/family consent for telemedicine: yes  Examination observed and performed by: Aram Beecham provider: Leonette Nutting, MD  -------------------------------------------------------------------------------------------------------  Hospital Day: 6  Start Time: 11:33     CHIEF COMPLAINT:  Inpatient daily psychiatric reevaluation    INTERVAL HISTORY:   Staff reports that she is less pressured, less sexually preoccupied.    Sleep all night last night.  Appetite good  Affect more varied.  Side effects denies   Bizzare behaviors overly enthusiastic  Hallucinatory behaviors none      On talking to patient: Patient states that Mood "really good."   Anxiety some, "I'm excited."      Denies paranoia or delusions.   Affect is expansive, pressured.  Denies headaches, nausea/ diarrhea/ constipation. Does not report lightheadedness/ palpitations or postural unsteadiness.   EPS      CURRENT PSYCHIATRIC REVIEW OF SYMPTOMS:  Auditory Hallucinations:  NO  Visual Hallucinations: NO  Paranoid ideation: NO    Homicidal Ideation: NO  Suicidal Ideation: NO    Abnormal and persistent elevated or expansive mood: YES  Abnormal and persistent irritable mood? NO  Increased Goal Directed Activity/Psychomotor agitation?  YES  Decreased need for sleep?  NO  Excessive or rapid speech?  YES  Racing thoughts?  POSSIBLE  Distractibility?  YES  Reckless behavior history?  NO      SOCIAL HISTORY:  Reviewed; no new information provided today. Please see the Admission note and Social Work documentation.      ALLERGIES   Allergies   Allergen Reactions    Darvocet A500 [Propoxyphene N-Acetaminophen]     Latex     Penicillins           MEDICAL REVIEW OF SYSTEMS  11 ROS reviewed, negative or  normal or has otherwise specified in subjective or HPI      VITALS  Blood pressure (!) 102/57, pulse 74, temperature 36.7 C (98.1 F), resp. rate 18, height 1.651 m (5\' 5" ), weight 63.5 kg (140 lb), last menstrual period 08/06/2022, SpO2 99%.    LABS  No results found for this or any previous visit (from the past 24 hour(s)).    SCHEDULED MEDS:    clonazePAM (klonoPIN) tablet 0.5 mg 2x/day    haloperidol (HALDOL) tablet 2 mg Daily    metroNIDAZOLE (FLAGYL) tablet 500 mg 2x/day    nicotine (NICODERM CQ) transdermal patch (mg/24 hr) 21 mg Daily    OLANZapine (zyPREXA) tablet 10 mg NIGHTLY    primidone (MYSOLINE) tablet 50 mg 3x/day    QUEtiapine (SEROquel) tablet 300 mg NIGHTLY      MENTAL STATUS EXAMINATION  Appearance:   appears stated age in street clothes  Level of Consciousness:  Alert   Behavior: Pleasant and Cooperative  Gait: Not tested  Psychomotor:  No agitation or retardation  Speech:  hyperverbal and pressured  Thought Process:  circumstantial  Thought Content:   no suicidal or homicidal ideation   Affect:  expansive   Mood:  happy/good  Orientation:  O x 4  Attention:  within normal limits  Concentration:  within normal limits  Memory:  unremarkable per conversation  Language:  fluent  Fund of Knowledge/Intellect:  adequate  Abstraction: intact  Insight: fair  Judgment:  fair  Impulse Control:  fair    ----------------------------------------------------------------------------------  -------------------------------------------------------------------------------------  ASSESSMENT: Jasmine [name] is a 36 y.o. female who was admitted with concerns for mania.  She reports the Seroquel has not been helpful.   She has been sexually inappropriate on the unit.       Denied SI/HI, still quite agitated and expansive.        12/26 -  continues to be agitated and sexually preoccupied, minimally better sleep.  Still delusional.    increase Seroquel 300 mg hs    12/27 - expansive, but less agitated.  No longer  sexually inappropriate, but overly enthusiastic and minimizing her condition.  She is no longer reporting overt delusions. She did finally sleep through the night.   Will continue increased Seroquel 300 mg hs        DIAGNOSIS:  Unspecified psychotic d/o  R/o bipolar with psychosis            PLAN:  MEDICATIONS:   Seroquel to 300 mg po q hs.             Continue admission to inpatient    INTERVENTION: Patient continues to require an extensive level of medical decision making due to complexity and acuity of illness and meets criteria for inpatient hospitalization. Discussed status, reviewed option for therapy. Discussed medicine, psychotherapy and structure/schedule of the program. Reviewed the plan, goal, rationale, risks, benefits and alternates. Patient indicated understanding and agreement.      ADDITIONAL RECOMMENDATIONS:   - Case management following for disposition planning      ELOS: 4-6      -------------------------------------------------------------------------------------      CPT Code:  45859 Level 1 Hospital Progress Note (if by time, at least 25 min)      Leonette Nutting, MD   _______________________________

## 2022-08-11 NOTE — Nurses Notes (Signed)
Pt out on unit dressed in own attire.  Affect dull but brightens with interaction and upon approach.  Mood continues to be manic but less so than previous shifts.  Bhs calm and cooperative.  Pt denies suicidal and/or homicidal ideation.  Denies AVOT hallucinations.  Pt is medication compliant and has been complaining of constipation.  Pt reports that she is safe on the unit as well as safe for discharge at this time.  Will continue to monitor per protocol.

## 2022-08-11 NOTE — Group Note (Signed)
Group topic:  RECREATION GROUP    Date of group:  08/11/2022  Start time of group:  1000  End time of group:  1050    Attend:  [x]   Not attend: []   Attendance:  inpatient attended all of group               Summary of group discussion: Patients participated in a game of Pictionary. The purpose of this activity was to improve patient mood and prompt creativity.       Jasmine [name]  is a 37 y.o. female participating in a recreation group.    Patient's mental status/affect: appropriate    Patient's behavior: cooperative    Patient's response: [name] actively participated in this group activity with no issues. Therapist witnessed [name] smiling, laughing, and interacting appropriately with peers throughout the group.       [name], LPC  08/11/2022, 11:22

## 2022-08-11 NOTE — Behavioral Health (Signed)
During Evening Wrap Up Group patient rated their depression 10/10. Patient reported crying most of the day due missing their family and wanting to go back to the rehab. Patient rated their anxiety 10/10. Patient denied suicidal and homicidal ideations, auditory and visual hallucinations. Patient spent most of the evening in their room. Patient slept most of the night. Staff will continue maintaining safety checks.

## 2022-08-11 NOTE — Group Note (Signed)
Group topic:  EVENING REFLECTION / WRAP UP GROUP    Date of group:  08/11/2022  Start time of group:  0710  End time of group:  0735    Attend: [x]   Not attend: []   Attendance:  inpatient attended all of group               Summary of group discussion:      Jasmine [name]  is a 36 y.o. female participating in a evening reflection group.    Patient's mental status/affect: bright    Patient's behavior: anxious    Patient's response: pt denies depression rates anxiety a 8 because she is anxious to go back to rehab. She denies si.hi and hallucinations safe for dc    Nykira Reddix L. Ludell Zacarias, MHS  08/11/2022, 19:37

## 2022-08-11 NOTE — Nurses Notes (Signed)
Received MOM for c/o constipation. She states that she has difficulty with her bowels usually. Encouraged po fluid intake in which she agreed to drink more water.

## 2022-08-11 NOTE — Behavioral Health (Signed)
Pt has been out on unit off and on entire shift calm cooperative and social with staff and  peers. Eats and sleeps well. Attends groups.

## 2022-08-11 NOTE — Nurses Notes (Signed)
Pacing back and forth, asked for a Vistaril for anxiety. "I am trying so hard. I don't think I am good enough. I'm going to group, trying to participate, it stresses me out". See emar.

## 2022-08-11 NOTE — Care Plan (Signed)
The patient spent most of the evening watching TV in the dayroom with peers.  All of her interactions were appropriate.  Her speech was normal and non-pressured.  She described her plans to return to rehab when she leaves her.  She says she is feeling much better on her medications.  She described herself as impulsive and out-of-control when she was sent from the rehab to the hospital.    The patient appeared to sleep the majority of the night.      Problem: Adult Behavioral Health Plan of Care  Goal: Patient-Specific Goal (Individualization)  Outcome: Ongoing (see interventions/notes)  Goal: Strengths and Vulnerabilities  Outcome: Ongoing (see interventions/notes)

## 2022-08-11 NOTE — Group Note (Signed)
Group topic:  GOALS/AFFIRMATIONS GROUP    Date of group:  08/11/2022  Start time of group:  0730  End time of group:  0800    Attend: [x]   Not attend: []   Attendance:  inpatient attended all of group               Summary of group discussion: Morning Goals Group.       Jasmine [name]  is a 36 y.o. female participating in a goal group.    Patient's mental status/affect:    Patient's behavior:    Patient's response: Patient reports sleeping "pretty good" last night. Patient denies depression and rates anxiety 8/10 stating she is "anxious to get out of here." Patient denies SI,HI,AVH. Patient contracts for safety on unit and feels safe for discharge.    [name], MHS  08/11/2022, 08:02

## 2022-08-11 NOTE — Group Note (Signed)
Psychoeducational Group Note  Date of group:  08/11/2022  Start time of group:  0900  End time of group:  0955    Attend:  [x]   Not attend:  []   Attendance:  inpatient attended all of group                Summary of group discussion: Patients were guided in a discussion about cognitive errors, and how they are thought patterns that cause distress, anxiety, and depression. Patients were provided a worksheet which contained and defined to most common types of cognitive errors, including all or nothing thinking, blaming, discounting the positive, emotional reasoning, fallacy of fairness, fortune telling, jumping to conclusions, labeling, magnification, minimization, mental filter, mind reading, over-catastrophizing, overgeneralization, personalization, playing the comparison game, and should statements. Patients then shared the cognitive errors that they most commonly engage in, as well as discussed ways to decrease the frequency of cognitive errors.        s a 36 y.o. female participating in a psychoeducational group.    Affect/Mood:  Appropriate    Thought Process:  Logical    Thought Content:  Within normal limits    Interpersonal:  Discussed issues, Attentive, Displayed insight, and Provided feedback    Level of participation:  Full    Comments: Stephenson actively participated in this group discussion. She followed along as the group went over the various types of cognitive errors on the hand out. Leeth identified her own cognitive errors as overgeneralization and personalization. Aye was attentive as the group discussed ways to decrease the frequency of these cognitive errors, including different types of grounding and thought stopping techniques.     [name], [facility] 08/11/2022 11:02

## 2022-08-11 NOTE — Behavioral Health (Signed)
Patient denies rates their anxiety 8/10. Patient denies feelings of depression, suicidal/homicidal ideations, and visual/auditory hallucinations. Patient was calm and pleasant while watching a movie with peers in the day room. Patient slept almost all night. Staff will continue maintaining safety checks.

## 2022-08-11 NOTE — Nurses Notes (Signed)
C/o lower abd cramping today. She does report her menses is done. Reports mild burning with urination. Jasmine Ply NP notified of burning upon urination, lower abd cramping with her menses over and culture results.

## 2022-08-12 LAB — SYPHILIS SCREENING ALGORITHM WITH REFLEX, SERUM: SYPHILIS TP ANTIBODIES: NONREACTIVE

## 2022-08-12 NOTE — Group Note (Signed)
Group topic:  RECREATION GROUP    Date of group:  08/12/2022  Start time of group:  1300  End time of group:  1350    Attend:  [x]   Not attend: []   Attendance:  inpatient attended all of group               Summary of group discussion: Pts participated in music listening and cards to promote positive socialization and relaxation.       LAINIE DAUBERT  is a 36 y.o. female participating in a recreation group.    Patient's mental status/affect:appropriate, pleasant    Patient's behavior: appropriate, social    Patient's response: PT participated in cards and music listening and was pleasant and social with others.       [name], Recreation Therapist  08/12/2022, 13:51

## 2022-08-12 NOTE — Group Note (Signed)
Group topic:  EVENING REFLECTION / WRAP UP GROUP    Date of group:  08/12/2022  Start time of group:  2000  End time of group:  2010    Attend: [x]   Not attend: []   Attendance:  inpatient attended all of group               Summary of group discussion: Evening Wrap Up Group      Jasmine [name]  is a 36 y.o. female participating in a evening reflection group.    Patient's mental status/affect: patient was calm and pleasant when approached by staff    Patient's behavior: patient was calm and pleasant while answering questions    Patient's response: patient rates their depression 0/10. Patient rates their anxiety 8/10. Patient denies SI/HI/AVH. Patient reports feeling safe on the unit and safe to leave. Patient states they are feeling their medications making them feel calmer while talking to and interacting with people than they were feeling when they were first admitted to the unit.    [place], 07-19-1973  08/12/2022, 20:27

## 2022-08-12 NOTE — Care Plan (Signed)
Jasmine [name] has been out on the unit for most of the day. She has been calm and cooperative while on the unit. She has not shown to be sexually preoccupied today. She reports that she slept good last night and feels rested today. She denies any suicidal or homicidal thoughts. She denies any hallucinations or paranoid thoughts. She denies any depression or anxiety but she still seems to be anxious at times. Speech is still pressured but has been getting better. Affect is dull but brightens with interactions. She has been compliant with medications and denies any side effects. She has been attending all groups so far today. She reports that she contracts for safety while on the unit and would be safe for discharge.

## 2022-08-12 NOTE — Group Note (Signed)
Group topic:  EDUCATION GROUP    Date of group:  08/12/2022  Start time of group:  2015  End time of group:  2030    Attend: [x]   Not Attend:  []   Attendance:  inpatient attended all of group    Summary of group:               Summary of group discussion: Medication compliance      Jasmine [name]  is a 36 y.o. female participating in a education group.    Patient observations: Acceptance      Patient goals:      Tressia Miners, RN  08/12/2022, 20:46

## 2022-08-12 NOTE — Behavioral Health (Signed)
PROGRESS NOTE        Telemedicine Documentation: This is a telemedicine note. Patient was treated using telemedicine, real time audio and video  Patient Location: [facility]  Provider Location: [facility]  Patient/family aware of provider location: yes  Patient/family consent for telemedicine: yes  Examination observed and performed by: Aram Beecham provider: Leonette Nutting, MD  -------------------------------------------------------------------------------------------------------  Hospital Day: 7  Start Time: 11:41     CHIEF COMPLAINT:  Inpatient daily psychiatric reevaluation    INTERVAL HISTORY:   Staff reports that has been less euphoic, appears pressured, expansive, taking meds, sleep well.  No sexual preoccupation.    Sleep good  Appetite good  Affect bright, expansive, giddy   Side effects denies   Bizzare behaviors 4-5 showers yesterday  Hallucinatory behaviors none noted      On talking to patient: Patient states that Mood "great, I'm doing amazing."  Anxiety none.     She is focused on getting back to rehab.      Denies paranoia or delusions.   Affect is expansive.  Denies headaches, nausea/ diarrhea/ constipation. Does not report lightheadedness/ palpitations or postural unsteadiness.   EPS      CURRENT PSYCHIATRIC REVIEW OF SYMPTOMS:  Auditory Hallucinations:  NO  Visual Hallucinations: NO  Paranoid ideation: NO    Homicidal Ideation: NO  Suicidal Ideation: NO    Abnormal and persistent elevated or expansive mood: YES  Abnormal and persistent irritable mood? NO  Increased Goal Directed Activity/Psychomotor agitation?  YES  Decreased need for sleep?  NO  Excessive or rapid speech?  YES  Racing thoughts?  SUSPECT  Distractibility?  SUSPECT  Reckless behavior history?  NO      SOCIAL HISTORY:  Reviewed; no new information provided today. Please see the Admission note and Social Work documentation.      ALLERGIES   Allergies   Allergen Reactions    Darvocet A500 [Propoxyphene N-Acetaminophen]      Latex     Penicillins           MEDICAL REVIEW OF SYSTEMS  11 ROS reviewed, negative or normal or has otherwise specified in subjective or HPI      VITALS  Blood pressure 109/69, pulse 100, temperature 36.5 C (97.7 F), resp. rate 18, height 1.651 m (5\' 5" ), weight 63.5 kg (140 lb), last menstrual period 08/06/2022, SpO2 100%.    LABS  No results found for this or any previous visit (from the past 24 hour(s)).    SCHEDULED MEDS:    clonazePAM (klonoPIN) tablet 0.5 mg 2x/day    haloperidol (HALDOL) tablet 2 mg Daily    metroNIDAZOLE (FLAGYL) tablet 500 mg 2x/day    nicotine (NICODERM CQ) transdermal patch (mg/24 hr) 21 mg Daily    OLANZapine (zyPREXA) tablet 10 mg NIGHTLY    polyethylene glycol (MIRALAX) oral packet 17 g Daily    primidone (MYSOLINE) tablet 50 mg 3x/day    QUEtiapine (SEROquel) tablet 300 mg NIGHTLY      MENTAL STATUS EXAMINATION  Appearance:   appears stated age in street clothes  Level of Consciousness:  Alert   Behavior: Pleasant and Cooperative  Gait: Not tested  Psychomotor:  No agitation or retardation  Speech:  hyperverbal and pressured  Thought Process:  circumstantial  Thought Content:   no suicidal or homicidal ideation   Affect:  expansive   Mood:  happy/good  Orientation:  O x 4  Attention:  within normal limits  Concentration:  within normal limits  Memory:  unremarkable per conversation  Language:  fluent  Fund of Knowledge/Intellect:  adequate  Abstraction: intact  Insight: fair  Judgment:  fair  Impulse Control:  fair    ----------------------------------------------------------------------------------  -------------------------------------------------------------------------------------  ASSESSMENT: Jasmine [name] is a 36 y.o. female who was admitted with concerns for mania.  She reports the Seroquel has not been helpful.   She has been sexually inappropriate on the unit.       Denied SI/HI, still quite agitated and expansive.        12/26 -  continues to be agitated and  sexually preoccupied, minimally better sleep.  Still delusional.    increase Seroquel 300 mg hs     12/27 - expansive, but less agitated.  No longer sexually inappropriate, but overly enthusiastic and minimizing her condition.  She is no longer reporting overt delusions. She did finally sleep through the night.   Will continue increased Seroquel 300 mg hs    12/28 -  no longer agitated, still expansive and hyper verbal.  Mood is still elevated.  She is sleeping well and is no longer sexually preoccupied.  Will continue current meds, but may need to increase Seroquel if her mood does not regulate more by tomorrow.        DIAGNOSIS:  Unspecified psychotic d/o  R/o bipolar with psychosis            PLAN:  MEDICATIONS:   Seroquel to 300 mg po q hs.         Continue admission to inpatient    INTERVENTION: Patient continues to require an extensive level of medical decision making due to complexity and acuity of illness and meets criteria for inpatient hospitalization. Discussed status, reviewed option for therapy. Discussed medicine, psychotherapy and structure/schedule of the program. Reviewed the plan, goal, rationale, risks, benefits and alternates. Patient indicated understanding and agreement.      ADDITIONAL RECOMMENDATIONS:   - Case management following for disposition planning      ELOS: 3-4      -------------------------------------------------------------------------------------      CPT Code:  17494 Level 1 Hospital Progress Note (if by time, at least 25 min)      Leonette Nutting, MD   _______________________________

## 2022-08-12 NOTE — Behavioral Health (Signed)
Pt is currently in room talking to herself. She has eaten well today. She has attended groups. Pt did take a nap for a couple hours this afternoon. She has not been as sexually preoccupied. She changes her clothes frequently and asks for more regardless of having numerous outfits. Will continue to monitor.     Fransico Michael, MHS

## 2022-08-12 NOTE — Group Note (Signed)
Psychoeducational Group Note  Date of group:  08/12/2022  Start time of group:  0900  End time of group:  0935    Attend:  _0   Not attend:  _1   Attendance:  inpatient attended all of group               Summary of group discussion: Pts completed an activity for writing a letter to their past selves. This activity included identifying how they felt while writing the letter and insight they had from reflecting.       Jasmine [name]  s a 36 y.o. female participating in a psychoeducational group.    Affect/Mood:  Appropriate    Thought Process:  Logical    Thought Content:  Within normal limits    Interpersonal:  Discussed issues    Level of participation:  Full    Comments: Pt attended group and completed the activity. Pt wrote her letter about individuals she met in her past that have had a negative impact on her life. Pt discussed that she would like to work on her sobriety and remain sober so that her family could be proud of her. Pt reports she would like to work on rebuilding a relationship with her family, especially her children.     [place], MSW 08/12/2022 09:37

## 2022-08-12 NOTE — H&P (Signed)
[facility]  [facility] BEH MED Admission H&P    Date of Service:  08/12/2022  Jasmine [name], Jasmine [name], 36 y.o. female  Encounter Start Date:  08/06/2022  Inpatient Admission Date: 08/06/2022   Date of Birth:  10/18/1985  PCP: Jasmine Foster, MD    Information Obtained from: patient  Chief Complaint:  history and physical     HPI: Jasmine [name] is a 36 y.o., White female who presented to the emergency room after EMS was called at the suds unit in [place].  She was displaying psychotic symptoms and mania.  Staff reports that she was sexually inappropriate upon arrival to the unit, but had been calm for the past couple of days.  Upon examination, patient was inappropriate, at 1st refusing examination stating "I just want to leave and my pussy hurts.  It is dry and I want that cream back. "    She was seen by the hospitalist for vaginal itching after being diagnosed with trichomoniasis and vaginal Candida.  Flagyl was continued and she was given ketoconazole cream.  It was advised if the issues continued that she would need to follow-up with OBGYN.  She initially reported to the hospitalist that she was not sexually active, but reported to this provider that she has had unprotected sex, the last occurrence being several weeks ago.  She also noticed yellow drainage approximately a week ago.  She has not sure whether she has been tested for any other STDs, but says "I would like it if I was checked. "    She appeared manic throughout our conversation and irritable.  She was compliant with a brief physical examination, but interview questions were kept brief due to patient's irritability and inappropriate comments.  When asked if using drugs or alcohol patient stated "of course I use drugs.  That is why I am here.  I use meth. "    PAST MEDICAL:    Past Medical History:   Diagnosis Date    Epileptic seizure ([facility])     Tourette's         History reviewed. No past surgical history pertinent negatives.          Medications Prior to Admission       Prescriptions    clonazePAM (KLONOPIN) 0.5 mg Oral Tablet    Take 1 Tablet (0.5 mg total) by mouth Every morning 0.5 mg AM, 0.25 mg PM    clonazePAM (KLONOPIN) 0.5 mg Oral Tablet    Take 0.5 Tablets (0.25 mg total) by mouth Every night 0.5 mg every am and 0.25 mg every HS    cloNIDine HCL (CATAPRES) 0.1 mg Oral Tablet    Take 1 Tablet (0.1 mg total) by mouth Three times a day as needed    cyclobenzaprine (FLEXERIL) 10 mg Oral Tablet    Take 1 Tablet (10 mg total) by mouth Three times a day    guanFACINE (INTUNIV) 1 mg Oral Tablet Sustained Release 24 hr    Take 1 Tablet (1 mg total) by mouth Every evening    hydrOXYzine pamoate (VISTARIL) 50 mg Oral Capsule    Take 1 Capsule (50 mg total) by mouth Three times a day as needed for Anxiety    Non-Formulary/Special Preparation (MEDICATION HELP)    Take 2 mg by mouth Every night Orap 2 mg by mouth every HS    OXcarbazepine (TRILEPTAL) 300 mg Oral Tablet    Take 1 Tablet (300 mg total) by mouth Twice daily  primidone (MYSOLINE) 50 mg Oral Tablet    Take 1 Tablet (50 mg total) by mouth Three times a day    QUEtiapine (SEROQUEL) 100 mg Oral Tablet    Take 1 Tablet (100 mg total) by mouth Every morning 100 mg AM, 200 mg PM    QUEtiapine (SEROQUEL) 200 mg Oral Tablet    Take 1 Tablet (200 mg total) by mouth Every night 100 mg every am and 200 mg qhs          Allergies   Allergen Reactions    Darvocet A500 [Propoxyphene N-Acetaminophen]     Latex     Penicillins        Family History  No familial history reported    Social History  Social History     Socioeconomic History    Marital status: Divorced     Spouse name: Not on file    Number of children: Not on file    Years of education: Not on file    Highest education level: Not on file   Occupational History    Not on file   Tobacco Use    Smoking status: Every Day     Types: Cigarettes    Smokeless tobacco: Never   Substance and Sexual Activity    Alcohol use: Never    Drug use: Yes      Types: Methamphetamines    Sexual activity: Not Currently     Partners: Male   Other Topics Concern    Not on file   Social History Narrative    Not on file     Social Determinants of Health     Financial Resource Strain: Not on file   Transportation Needs: Not on file   Social Connections: Unknown (08/06/2022)    Social Connections     SDOH Social Isolation: Patient chooses not to answer   Intimate Partner Violence: Not on file   Housing Stability: Not on file       ROS: Other than ROS in the HPI, all other systems were negative.    Examination:  Temperature: 36.5 C (97.7 F) Heart Rate: 100 BP (Non-Invasive): 109/69   Respiratory Rate: 18 SpO2: 100 %       General: appears older than stated age  Eyes: Conjunctiva clear.  HENT:ENMT without erythema or injection, mucous membranes moist.  Neck: no thyromegaly or lymphadenopathy  Lungs: Clear to auscultation bilaterally.   Cardiovascular: regular rate and rhythm  Abdomen: Soft, non-tender, Bowel sounds normal  Genito-urinary: Deferred  Extremities: No cyanosis or edema  Skin: Skin warm and dry  Neurologic: Grossly normal  Lymphatics: No lymphadenopathy  Psychiatric:  Appears man    Labs:    CBC Results Differential Results   No results found for this or any previous visit (from the past 30 hour(s)). No results found for this or any previous visit (from the past 30 hour(s)).     BMP Results Other Chemistries Results   No results found for this or any previous visit (from the past 30 hour(s)). No results found for this or any previous visit (from the past 30 hour(s)).     Liver/Pancreas Enzyme Results Liver Function Results   No results found for this or any previous visit (from the past 30 hour(s)). No results found for this or any previous visit (from the past 30 hour(s)).     Cardiac Results Coags Results   No results found for this or any previous visit (from the past 30  hour(s)). No results found for this or any previous visit (from the past 30 hour(s)).        Imaging Studies:  N/A    DNR Status:  Full Code    Prevention  DVT/PE Prophylaxis: Not indicated, low risk for VTE    Plan:  Vaginal itching/irritation:  Will obtain syphilis, gonorrhea/chlamydia labs to rule out STDs patient's symptoms.  Given patient's irritation and wanting to leave, I feel she would be an elopement risk if she were to be transported to OBGYN at this time.  Nursing staff does not believe it would be possible for OBGYN to do an examination on the floor given lack of equipment.  I would advise patient to follow up outpatient with OBGYN.    Bipolar disorder/psychosis- per attending psychiatrist    Abstain from drug use after discharge    Obtain PCP after discharge    I independently of the faculty provider spent a total of (20) minutes in direct/indirect care of this patient including initial evaluation, review of laboratory, radiology, diagnostic studies, review of medical record, order entry and coordination of care.     Leverne Humbles, NP   I reviewed this case and I agree with the Treatment Plan.  Thomes Cake. Neeka Urista MD

## 2022-08-12 NOTE — Group Note (Signed)
Group topic:  GOALS/AFFIRMATIONS GROUP    Date of group:  08/12/2022  Start time of group:  0730  End time of group:  0800    Attend: [x]   Not attend: []   Attendance:  inpatient attended all of group               Summary of group discussion:   Pts were asked about sleep, dep/anx, si/hi/avh, safety and goal for the day. Following are their responses:      [name]  is a 36 y.o. female participating in a goal group.    Patient's mental status/affect:  appropriate / appropriate    Patient's behavior:  optimistic social with staff     Patient's response:   Pt slept really good denies dep/anx, si/hi/avh and contracts. Goal is to learn more patience    [name]. Andrius Andrepont, MHS  08/12/2022, 08:10

## 2022-08-12 NOTE — Group Note (Signed)
Group topic:  RECREATION GROUP    Date of group:  08/12/2022  Start time of group:  1000  End time of group:  1115    Attend:  [x]   Not attend: []   Attendance:  inpatient attended 45 minutes               Summary of group discussion: Pts participated in discussion about using art activities to redirect thoughts for improved coping. As an example, pts followed instruction to complete splatter style paintings.      Jasmine [name]  is a 36 y.o. female participating in a recreation group.    Patient's mental status/affect: appropriate, pleasant    Patient's behavior: appropriate, social    Patient's response: Pt participated in discussion and activity and was pleasant and social with others while in group. Pt shared that she is planning for discharge "hopefully tomorrow". PT stated she feels like herself and much less anxious and "twitchy". Pt stated she feels safe to leave and hopeful to start the new year at rehab.      [name], Recreation Therapist  08/12/2022, 11:17

## 2022-08-13 DIAGNOSIS — F29 Unspecified psychosis not due to a substance or known physiological condition: Secondary | ICD-10-CM

## 2022-08-13 MED ORDER — MAGNESIUM HYDROXIDE 400 MG/5 ML ORAL SUSPENSION
30.0000 mL | Freq: Two times a day (BID) | ORAL | Status: DC | PRN
Start: 2022-08-13 — End: 2022-08-17

## 2022-08-13 MED ORDER — QUETIAPINE 400 MG TABLET
500.0000 mg | ORAL_TABLET | Freq: Every evening | ORAL | Status: DC
Start: 2022-08-13 — End: 2022-08-17
  Administered 2022-08-13 – 2022-08-16 (×4): 500 mg via ORAL
  Filled 2022-08-13 (×4): qty 1

## 2022-08-13 MED ORDER — ALUMINUM-MAG HYDROXIDE-SIMETHICONE 200 MG-200 MG-20 MG/5 ML ORAL SUSP
5.0000 mL | ORAL | Status: DC | PRN
Start: 2022-08-13 — End: 2022-08-17
  Administered 2022-08-16: 5 mL via ORAL
  Filled 2022-08-13: qty 30

## 2022-08-13 NOTE — Behavioral Health (Signed)
Pt has been out on the unit majority of the shift. She has attended groups. She has eaten well at meals. She is currently in the day room watching TV with peers. She has not been sexually inappropriate today. Pt has not had any irritable outbursts and does not appear to be responding to internal stimuli as much. Will continue to monitor.     Fransico Michael, MHS

## 2022-08-13 NOTE — Nurses Notes (Signed)
Pt isolated to her assigned room most of first part of shift (7p-11p).  Affect dull but does brighten upon approach and with interaction.  Mood anxious.  Bhs isolative but cooperative.  Pt denies suicidal and/or homicidal ideation.  Denies AVOT hallucinations.  Pt did attend and participate in evening wrap up group as well as nursing education group.  Medication compliant and did request PRN Benadryl to facilitate sleep.  Pt reports that she is safe on the unit as well as safe for discharge at this time.  Will continue to monitor per protocol.

## 2022-08-13 NOTE — Group Note (Signed)
Group topic:  EVENING REFLECTION / WRAP UP GROUP    Date of group:  08/13/2022  Start time of group:  0720  End time of group:  0750    Attend: []   Not attend: [x]   Attendance:  inpatient attended 0 minutes               Summary of group discussion:      Jasmine [name]  is a 36 y.o. female participating in a evening reflection group.    Patient's mental status/affect:    Patient's behavior:    Patient's response:    Alim Cattell L. Heitor Steinhoff, MHS  08/13/2022, 22:49

## 2022-08-13 NOTE — Care Plan (Signed)
Patient has been out on the unit this morning. She is restless. Speech is pressured. She reports feeling "calmer". She still has difficulty sitting still for extended periods of times. She is bright and social. Has not had any periods of irritability with staff and has been very pleasant. She reports sleeping well last night. She has been compliant with medications. She denies any suicidal or homicidal thoughts. She denies hallucinations. She has not appeared to have been responding to any internal stimuli this morning. She attends groups. Spends most of her free time in day area. Will continue to monitor.   Problem: Adult Behavioral Health Plan of Care  Goal: Patient-Specific Goal (Individualization)  Outcome: Ongoing (see interventions/notes)     Problem: Psychotic Signs/Symptoms  Goal: Improved Behavioral Control (Psychotic Signs/Symptoms)  Outcome: Ongoing (see interventions/notes)

## 2022-08-13 NOTE — Care Plan (Signed)
Pt. Is currently resting in her room with eyes closed, respirations regular. Will continue to monitor.   Problem: Adult Behavioral Health Plan of Care  Goal: Patient-Specific Goal (Individualization)  Outcome: Ongoing (see interventions/notes)

## 2022-08-13 NOTE — Nurses Notes (Signed)
Patient has had more psychomotor agitation this afternoon.  At one point was aggressively pacing around the halls. Was heard yelling to self while pacing. It was difficult to understand what she was saying, but appeared angry and was heard cursing. She has been drinking what seems an excessive amount of water. While pacing the halls, every time she made a lap around the halls she would stop for a cup of water. Continues to complain of vaginal discomfort. She has asked for 6 bottles of lotion this shift, for her "dry feet." It is questionable if patient is actually using this lotion for only her dry feet. Will continue to monitor.

## 2022-08-13 NOTE — Group Note (Signed)
Group topic:  EDUCATION GROUP    Date of group:  08/13/2022  Start time of group:  2000  End time of group:  2030    Attend: [x]   Not Attend:  []   Attendance:  inpatient attended all of group    Summary of group:  Discussed with pts positive coping skills to utilize after discharge             Summary of group discussion:  Positive coping skills      Jasmine [name]  is a 36 y.o. female participating in a education group.    Patient observations:  Pt did attend and participate in group activity      Patient goals:      [name], RN  08/13/2022, 21:29

## 2022-08-13 NOTE — Group Note (Signed)
Group topic:  RECREATION GROUP    Date of group:  08/13/2022  Start time of group:  1000  End time of group:  1100    Attend:  [x]   Not attend: []   Attendance:  inpatient attended all of group               Summary of group discussion: Pts participated in discussion about healthy and unhealthy coping skills and hobbies to replace bad habits. Pts then followed instructions to complete an origami frog as an example of a healthy hobby.       Jasmine [name]  is a 36 y.o. female participating in a recreation group.    Patient's mental status/affect: appropriate, pleasant    Patient's behavior: appropriate, social    Patient's response: Pt participated in discussion and activity and was pleasant and social with others while in group. PT doubted her ability to get through task, but with encouragement to slow down and closely observe instruction, pt was able to complete task.       [name], Recreation Therapist  08/13/2022, 11:26

## 2022-08-13 NOTE — Group Note (Signed)
Group topic:  GOALS/AFFIRMATIONS GROUP    Date of group:  08/13/2022  Start time of group:  0730  End time of group:  0820    Attend: [x]   Not attend: []   Attendance:  inpatient attended all of group               Summary of group discussion:  Sleep (poor, fair and good), Depression/Anxiety 1-10 (10 being the worst), Suicidal (Plan contracts for safety on the unit No plan), Homicidal( Denies or Yes and towards who), Hallucinations (Visual what you see or Auditory what they are saying), Contracts safety on the unit, Safe to leave, was their goal completed today.       Jasmine [name]  is a 36 y.o. female participating in a goal group.    Patient's mental status/affect: brightens    Patient's behavior: appropriate    Patient's response: Pt reports sleeping pretty good. She denies depression but rates anxiety 6/10. She denies SI/HI/AVI. She feels safe here and safe to go. Her goal is to go home.     [name] [name], MHS  08/13/2022, 08:29

## 2022-08-13 NOTE — Group Note (Signed)
Group topic:  RECREATION GROUP    Date of group:  08/13/2022  Start time of group:  1300  End time of group:  1400    Attend:  [x]   Not attend: []   Attendance:  inpatient attended all of group               Summary of group discussion: Pts participated in coloring, music listening, and cards to promote positive socialization and relaxation.       KEAMBER MACFADDEN  is a 36 y.o. female participating in a recreation group.    Patient's mental status/affect: appropriate, pleasant    Patient's behavior: appropriate, social    Patient's response: PT participated in coloring and music listening. She was pleasant and social and stated she is trying to relax about her discharge date and is planning to "accept it as a thing I cannot change and just focus on what I can do, like stay positive."      [name], Recreation Therapist  08/13/2022, 14:08

## 2022-08-13 NOTE — Nurses Notes (Signed)
Approached this RN, stating, "my heart hurt"; Pulse 65 and regular, BP119/74; shared my observation that she has been drinking a lots of water this afternoon, responded, "I'm trying to get rid of my caffeine headache." Motrin and Vistaril given, when asked for pain level for the Motrin - reported that "her back pain was a 10".

## 2022-08-13 NOTE — Behavioral Health (Signed)
Pt approached nurses station and asked if the doctor that she speaks to is here. Staff informed her that the telehealth doctor would not be back on until tomorrow and pt stated "well that is going to be a problem. I need to tell him I need out of here by tomorrow or I am going to go insane".     Will continue to monitor.     Fransico Michael, MHS

## 2022-08-13 NOTE — Group Note (Signed)
Psychoeducational Group Note  Date of group:  08/13/2022  Start time of group:  0900  End time of group:  1000    Attend:  [x]   Not attend:  []   Attendance:  inpatient attended all of group               Summary of group discussion: Patients were guided in a discussion about self-esteem, and how the way that we see ourselves directly impacts our mental health. Patients were provided a worksheet containing the alphabet, and asked to write something down that they like about themselves beginning with each letter. Patients then shared their lists with the group.        s a 36 y.o. female participating in a psychoeducational group.    Affect/Mood:  Appropriate    Thought Process:  Logical    Thought Content:  Within normal limits    Interpersonal:  Discussed issues, Attentive, Displayed insight, and Provided feedback    Level of participation:  Full    Comments: [name] actively participated in this group discussion. She described her self-esteem as "very low". Despite this, she was able to identify several things that she likes about herself, including her attitude, behavior, cooperative, good, kind, nice, outstanding, and more. [name] acknowledged that taking time to focus on out positive qualities can help [name] improve positive thinking and improve overall self-esteem.     [name], [facility] 08/13/2022 10:39

## 2022-08-13 NOTE — Behavioral Health (Signed)
Patient currently resting in bed. Patient woke up x1 throughout the night to get a drink and went back to sleep.  Lynsey Ange Visual merchandiser, MHS

## 2022-08-13 NOTE — Behavioral Health (Signed)
PROGRESS NOTE        Telemedicine Documentation: This is a telemedicine note. Patient was treated using telemedicine, real time audio and video  Patient Location: [facility]  Provider Location: [facility]  Patient/family aware of provider location: yes  Patient/family consent for telemedicine: yes  Examination observed and performed by: Aram Beecham provider: Leonette Nutting, MD  -------------------------------------------------------------------------------------------------------  Hospital Day: 8  Start Time: 08:49     CHIEF COMPLAINT:  Inpatient daily psychiatric reevaluation    INTERVAL HISTORY:   Staff reports that she is talking to herself in her room, but not in groups, pacing in day room.  She is labile and expansive.  Sleep fully night  Appetite good  Affect expansive, gregarious  Side effects denies akathisia  Bizzare behaviors none  Hallucinatory behaviors talking to self in room      On talking to patient: Patient states that Mood "great" she denies any grandiosity or euphoria.  Pts mood is quite elevated  Anxiety denies, but has been pacing and up at nursing station.    Denies paranoia or delusions.   Affect is labile, euphoria, expansive.  Denies headaches, nausea/ diarrhea/ constipation. Does not report lightheadedness/ palpitations or postural unsteadiness.   Denies, EPS      CURRENT PSYCHIATRIC REVIEW OF SYMPTOMS:  Auditory Hallucinations:  NO  Visual Hallucinations: NO  Paranoid ideation: NO    Homicidal Ideation: NO  Suicidal Ideation: NO    Abnormal and persistent elevated or expansive mood: YES  Abnormal and persistent irritable mood? NO  Increased Goal Directed Activity/Psychomotor agitation?  YES  Decreased need for sleep?  NO  Excessive or rapid speech?  YES  Racing thoughts?  YES  Distractibility?  NO  Reckless behavior history?  NO      SOCIAL HISTORY:  Reviewed; no new information provided today. Please see the Admission note and Social Work documentation.      ALLERGIES    Allergies   Allergen Reactions    Darvocet A500 [Propoxyphene N-Acetaminophen]     Latex     Penicillins           MEDICAL REVIEW OF SYSTEMS  11 ROS reviewed, negative or normal or has otherwise specified in subjective or HPI      VITALS  Blood pressure (!) 119/90, pulse 92, temperature 36.5 C (97.7 F), resp. rate 17, height 1.651 m (5\' 5" ), weight 63.5 kg (140 lb), last menstrual period 08/06/2022, SpO2 100%.    LABS  Results for orders placed or performed during the hospital encounter of 08/06/22 (from the past 24 hour(s))   SYPHILIS SCREENING ALGORITHM WITH REFLEX, SERUM   Result Value Ref Range    SYPHILIS TP ANTIBODIES Non-reactive Non-reactive       SCHEDULED MEDS:    clonazePAM (klonoPIN) tablet 0.5 mg 2x/day    haloperidol (HALDOL) tablet 2 mg Daily    metroNIDAZOLE (FLAGYL) tablet 500 mg 2x/day    nicotine (NICODERM CQ) transdermal patch (mg/24 hr) 21 mg Daily    OLANZapine (zyPREXA) tablet 10 mg NIGHTLY    polyethylene glycol (MIRALAX) oral packet 17 g Daily    primidone (MYSOLINE) tablet 50 mg 3x/day    QUEtiapine (SEROquel) tablet 300 mg NIGHTLY      MENTAL STATUS EXAMINATION  Appearance:   appears stated age in street clothes  Level of Consciousness:  Alert   Behavior: Pleasant and Cooperative  Gait: Not tested  Psychomotor:  No agitation or retardation  Speech:  hyperverbal and pressured  Thought Process:  circumstantial  Thought Content:   no suicidal or homicidal ideation   Affect:  expansive   Mood:  happy/good  Orientation:  O x 4  Attention:  within normal limits  Concentration:  within normal limits  Memory:  unremarkable per conversation  Language:  fluent  Fund of Knowledge/Intellect:  adequate  Abstraction: intact  Insight: fair  Judgment:  fair  Impulse Control:  fair    ----------------------------------------------------------------------------------  -------------------------------------------------------------------------------------  ASSESSMENT: Jasmine [name] is a 36 y.o. female  who was admitted with concerns for mania.  She reports the Seroquel has not been helpful.   She has been sexually inappropriate on the unit.       Denied SI/HI, still quite agitated and expansive.        12/26 -  continues to be agitated and sexually preoccupied, minimally better sleep.  Still delusional.    increase Seroquel 300 mg hs     12/27 - expansive, but less agitated.  No longer sexually inappropriate, but overly enthusiastic and minimizing her condition.  She is no longer reporting overt delusions. She did finally sleep through the night.   Will continue increased Seroquel 300 mg hs     12/28 -  no longer agitated, still expansive and hyper verbal.  Mood is still elevated.  She is sleeping well and is no longer sexually preoccupied.  Will continue current meds, but may need to increase Seroquel if her mood does not regulate more by tomorrow.     12/29 - The patient's mood is still quite elevated. She is not agitated . Hyper verbal, expansive.  Nursing has noted she is talking to herself in her room, but this is not happening in the day room.   Will increase Seroquel to 500 mg hs.    She may need a frank mood stabilizer if her mood does not moderate.  Her diagnosis of "tourette's" is new as of the last few months.  There is no prior history.    Will also try to consolidate antipsychotics over the next few days.Marland Kitchen       DIAGNOSIS:  Unspecified psychotic d/o  R/o bipolar with psychosis            PLAN:  MEDICATIONS:   Increase Seroquel to 500 mg po q hs.        Continue admission to inpatient    INTERVENTION: Patient continues to require an extensive level of medical decision making due to complexity and acuity of illness and meets criteria for inpatient hospitalization. Discussed status, reviewed option for therapy. Discussed medicine, psychotherapy and structure/schedule of the program. Reviewed the plan, goal, rationale, risks, benefits and alternates. Patient indicated understanding and  agreement.      ADDITIONAL RECOMMENDATIONS:   - Case management following for disposition planning      ELOS: 3-4      -------------------------------------------------------------------------------------      CPT Code:  35009 Level 2 Hospital Progress Note (if by time, at least 35 min)      Leonette Nutting, MD   _______________________________

## 2022-08-13 NOTE — Nurses Notes (Signed)
Complaining of her "feet  being dry" has been  given six (6) body lotions today.  Overheard talking to self; Walking the halls; with each pass has reached for a cup of water.  Returned to room at this time.

## 2022-08-14 DIAGNOSIS — F952 Tourette's disorder: Secondary | ICD-10-CM

## 2022-08-14 MED ORDER — DIVALPROEX 500 MG TABLET,DELAYED RELEASE
500.0000 mg | DELAYED_RELEASE_TABLET | Freq: Two times a day (BID) | ORAL | Status: DC
Start: 2022-08-14 — End: 2022-08-17
  Administered 2022-08-14 – 2022-08-17 (×7): 500 mg via ORAL
  Filled 2022-08-14 (×7): qty 1

## 2022-08-14 NOTE — Group Note (Signed)
Group topic:  RECREATION GROUP    Date of group:  08/14/2022  Start time of group:  1100  End time of group:  1150    Attend:  [x]   Not attend: []   Attendance:  inpatient attended all of group               Summary of group discussion: Patients participated in a game of Apples to Apples. The purpose of this activity was to improve patient mood, increase socialization, and promote positive relaxation.       Jasmine [name]  is a 36 y.o. female participating in a recreation group.    Patient's mental status/affect: appropriate    Patient's behavior: cooperative    Patient's response: [name] actively participated in this group activity with no issues. Therapist witnessed [name] smiling, laughing, and interacting appropriately with peers throughout the group.       [name], LPC  08/14/2022, 12:11

## 2022-08-14 NOTE — Group Note (Signed)
Group topic:  GOALS/AFFIRMATIONS GROUP    Date of group:  08/14/2022  Start time of group:  0710  End time of group:  0800    Attend: [x]   Not attend: []   Attendance:  inpatient attended all of group               Summary of group discussion: Sleep (poor, fair and good), Depression/Anxiety 1-10 (10 being the worst), Suicidal (Plan contracts for safety on the unit No plan), Homicidal( Denies or Yes and towards who), Hallucinations (Visual what you see or Auditory what they are saying), Contracts safety on the unit, Safe to leave, was their goal completed today.       Jasmine [name]  is a 36 y.o. female participating in a goal group.    Patient's mental status/affect:    Patient's behavior:    Patient's response: Patient reports "slept hard was good", Depression and Anxiety 7, Denies Suicidal,Homicidal and Hallucinations. Contracts safety on the unit. Safe on the unit and safe for discharge. Goal-Talk to mom.    [name], MHS  08/14/2022, 10:34

## 2022-08-14 NOTE — Care Plan (Signed)
Patient is out on the unit. She is dressed in her own clothing and is wearing a winter glove to her right hand, "I just like wearing this glove." Denies SI/HI or hallucinations. Rates her anxiety and depression a 7/10 each. Relates her anxiety to being "anxious to go home" and depression to "not being at rehab and interacting with people." States that she slept "hard, really good." She remains restless but is cooperative and pleasant.  States that she feels safe on the unit and would feel safe going home. Will monitor.   Problem: Adult Behavioral Health Plan of Care  Goal: Patient-Specific Goal (Individualization)  Outcome: Ongoing (see interventions/notes)  Flowsheets (Taken 08/14/2022 0700)  Individualized Care Needs: Provide a safe and therapeutic environment  Anxieties, Fears or Concerns: ""I'm anxious to go home."  Patient-Specific Goals (Include Timeframe): "have a good day. Maybe get out of here."  Volanda Napoleon, RN

## 2022-08-14 NOTE — Behavioral Health (Signed)
PROGRESS NOTE        Telemedicine Documentation: This is a telemedicine note. Patient was treated using telemedicine, real time audio and video  Patient Location: [facility]  Provider Location: [facility]  Patient/family aware of provider location: yes  Patient/family consent for telemedicine: yes  Examination observed and performed by: Aram Beecham provider: Leonette Nutting, MD  -------------------------------------------------------------------------------------------------------  Hospital Day: 9  Start Time: 11:44     CHIEF COMPLAINT:  Inpatient daily psychiatric reevaluation    INTERVAL HISTORY:   Staff reports that she is 7/10 anxiety and depression. Safe, no SI, wants to leave.    Sleep good.  Appetite good  Affect expansive  Side effects none  Bizzare behaviors none  Hallucinatory behaviors none      On talking to patient: Patient states that Mood is "great"  Anxiety "no not at all."    Denies paranoia or delusions.   Affect is expansive.  Denies headaches, nausea/ diarrhea/ constipation. Does not report lightheadedness/ palpitations or postural unsteadiness.   None EPS      CURRENT PSYCHIATRIC REVIEW OF SYMPTOMS:  Auditory Hallucinations:  NO  Visual Hallucinations: NO  Paranoid ideation: NO    Homicidal Ideation: NO  Suicidal Ideation: NO    Abnormal and persistent elevated or expansive mood: YES  Abnormal and persistent irritable mood? NO  Increased Goal Directed Activity/Psychomotor agitation?  YES  Decreased need for sleep?  NO  Excessive or rapid speech?  YES  Racing thoughts?  SUSPECT  Distractibility?  YES  Reckless behavior history?  NO      SOCIAL HISTORY:  Reviewed; no new information provided today. Please see the Admission note and Social Work documentation.      ALLERGIES   Allergies   Allergen Reactions    Darvocet A500 [Propoxyphene N-Acetaminophen]     Latex     Penicillins           MEDICAL REVIEW OF SYSTEMS  11 ROS reviewed, negative or normal or has otherwise specified in  subjective or HPI      VITALS  Blood pressure 96/68, pulse 81, temperature 36 C (96.8 F), resp. rate 17, height 1.651 m (5\' 5" ), weight 63.5 kg (140 lb), last menstrual period 08/06/2022, SpO2 98%.    LABS  No results found for this or any previous visit (from the past 24 hour(s)).    SCHEDULED MEDS:    clonazePAM (klonoPIN) tablet 0.5 mg 2x/day    haloperidol (HALDOL) tablet 2 mg Daily    metroNIDAZOLE (FLAGYL) tablet 500 mg 2x/day    nicotine (NICODERM CQ) transdermal patch (mg/24 hr) 21 mg Daily    OLANZapine (zyPREXA) tablet 10 mg NIGHTLY    polyethylene glycol (MIRALAX) oral packet 17 g Daily    primidone (MYSOLINE) tablet 50 mg 3x/day    QUEtiapine (SEROquel) tablet 500 mg 500 mg NIGHTLY      MENTAL STATUS EXAMINATION  Appearance:   appears stated age  Level of Consciousness:  Alert   Behavior: Cooperative to the level of ability   Gait: Not tested  Psychomotor:  Restless  Speech:  hyperverbal  Thought Process:  goal directed but disorganized at times  Thought Content:   no suicidal or homicidal ideation   Affect:  expansive   Mood:  elated  Orientation:  O x 3  Attention:  within normal limits  Concentration:  fair  Memory:  intact  Language:  fluent  Fund of Knowledge/Intellect:  adequate  Abstraction: able to abstract  Insight: fair  Judgment:  fair  Impulse Control:  fair    ----------------------------------------------------------------------------------    -------------------------------------------------------------------------------------  ASSESSMENT: Jasmine [name] is a 36 y.o. female who was admitted with concerns for mania.  She reports the Seroquel has not been helpful.   She has been sexually inappropriate on the unit.       Denied SI/HI, still quite agitated and expansive.        12/26 -  continues to be agitated and sexually preoccupied, minimally better sleep.  Still delusional.    increase Seroquel 300 mg hs     12/27 - expansive, but less agitated.  No longer sexually inappropriate, but  overly enthusiastic and minimizing her condition.  She is no longer reporting overt delusions. She did finally sleep through the night.   Will continue increased Seroquel 300 mg hs     12/28 -  no longer agitated, still expansive and hyper verbal.  Mood is still elevated.  She is sleeping well and is no longer sexually preoccupied.  Will continue current meds, but may need to increase Seroquel if her mood does not regulate more by tomorrow.     12/29 - The patient's mood is still quite elevated. She is not agitated . Hyper verbal, expansive.  Nursing has noted she is talking to herself in her room, but this is not happening in the day room.   Will increase Seroquel to 500 mg hs.    She may need a frank mood stabilizer if her mood does not moderate.  Her diagnosis of "tourette's" is new as of the last few months.  There is no prior history.    Will also try to consolidate antipsychotics over the next few days..        12/30 - pt still expansive and elated, pressured and hyper verbal.  Reviewed history again, no h/o manic episodes prior, Tourette's diagnosed 3 weeks ago, no prior history.  She has never been on lithium or Depakote.  Is willing to start Depakote.  Will stop the haldol at this point.       DIAGNOSIS:  Unspecified psychotic d/o  R/o bipolar with psychosis      PLAN:  MEDICATIONS:   Seroquel to 500 mg po q hs.  Continue Zyprexa   Start Depakote 500 mg po bid     Continue admission to inpatient    INTERVENTION: Patient continues to require an extensive level of medical decision making due to complexity and acuity of illness and meets criteria for inpatient hospitalization. Discussed status, reviewed option for therapy. Discussed medicine, psychotherapy and structure/schedule of the program. Reviewed the plan, goal, rationale, risks, benefits and alternates. Patient indicated understanding and agreement.      ADDITIONAL RECOMMENDATIONS:   - Case management following for disposition planning      ELOS:  3-4      -------------------------------------------------------------------------------------      CPT Code:  17510 Level 2 Hospital Progress Note (if by time, at least 35 min)      Leonette Nutting, MD   _______________________________

## 2022-08-14 NOTE — Behavioral Health (Signed)
Pt is resting in bed and is awake. She has been a little irritable this am. She did sleep approx  four and a half hours last night

## 2022-08-14 NOTE — Group Note (Signed)
Group topic:  EVENING REFLECTION / WRAP UP GROUP    Date of group:  08/14/2022  Start time of group:  1930  End time of group:  1940    Attend: [x]   Not attend: []   Attendance:  inpatient attended all of group               Summary of group discussion: End of the Day Wrap Up Group      Jasmine [name]  is a 36 y.o. female participating in a evening reflection group.    Patient's mental status/affect: patient was calm and pleasant when approached by staff    Patient's behavior: patient was calm and pleasant while answering questions    Patient's response: patient rated their depression 5/10 and their anxiety 8/10. Patient denies SI/HI/AVH. Patient reports feeling safe on the unit and safe to leave.    31, 7/10  08/14/2022, 19:38

## 2022-08-14 NOTE — Group Note (Signed)
Group topic:  RECREATION GROUP    Date of group:  08/14/2022  Start time of group:  1430  End time of group:  1630    Attend:  [x]   Not attend: []   Attendance:  inpatient attended all of group               Summary of group discussion: worked on fuse beads       Jasmine [name]  is a 35 y.o. female participating in a recreation group.    Patient's mental status/affect:    Patient's behavior:    Patient's response: Patient attended and participated in group she did a fuse bear.       Ky Barban, MHS  08/14/2022, 16:30

## 2022-08-14 NOTE — Group Note (Signed)
Group topic:  EDUCATION GROUP    Date of group:  08/14/2022  Start time of group:  2000  End time of group:  2015    Attend: [x]   Not Attend:  []   Attendance:  inpatient attended all of group    Summary of group:               Summary of group discussion: Discussed the importance of taking prescribed medications.       Jasmine [name]  is a 36 y.o. female participating in a education group.    Patient observations: Acceptance      Patient goals:      Tressia Miners, RN  08/14/2022, 20:27

## 2022-08-14 NOTE — Group Note (Signed)
Psychoeducational Group Note  Date of group:  08/14/2022  Start time of group:  1000  End time of group:  1050    Attend:  [x]   Not attend:  []   Attendance:  inpatient attended all of group               Summary of group discussion: Patients were guided in a discussion about gratitude, and how practicing it regularly and increase positive thinking and improve overall mental health. Patients were provided a worksheet with many lines, in which they were instructed to write something that they are grateful for on each line, ultimately creating a gratitude list. Patients then shared their lists, and discussed how it felt to focus on the positive aspects of their lives.        s a 36 y.o. female participating in a psychoeducational group.    Affect/Mood:  Appropriate    Thought Process:  Logical    Thought Content:  Within normal limits    Interpersonal:  Discussed issues, Attentive, Displayed insight, and Provided feedback    Level of participation:  Full    Comments: Robart actively participated in this group discussion. She was able to identify several items for her gratitude list, including her family, friends, children, medicine, and more. Hudman was attentive as the group discussed the benefits of practicing gratitude regularly.     [name], [facility] 08/14/2022 11:54

## 2022-08-14 NOTE — Care Plan (Signed)
Pt resting in bed with eyes closed.  Resp quiet and non-labored.  Provided calm and quiet environment conducive to sleep.  Pt appears to have slept well throughout shift.  Will continue to monitor per protocol.  Problem: Adult Behavioral Health Plan of Care  Goal: Patient-Specific Goal (Individualization)  Outcome: Ongoing (see interventions/notes)

## 2022-08-14 NOTE — Behavioral Health (Signed)
Patient is currently in her room. She's been calm and cooperative. She's been pleasant and social with peers and staff. Her and another peer were walking the halls today. She brightens with interaction. She's talked to her mom several times today. She stated her mom gave her, her sisters address. Jasmine [name] stated She hasn't talked to her sister in 5years because she didn't like [name]'s ways. She said she's going to write her and when she's gets back to rehab she will have them mail it. She denies suicidal,homicidal and hallucinations. Safe on the unit and safe for discharge.  Ky Barban, MHS

## 2022-08-14 NOTE — Behavioral Health (Signed)
During evening wrap up group patient rated their depression 5/10 and their anxiety 8/10. Patient denied suicidal and homicidal ideations, visual and auditory hallucinations. Patient was calm and pleasant while spending time in the day room watching television. Patient could be heard several times yelling in her room. Patient slept almost all night. Staff will continue maintaining safety checks.

## 2022-08-15 NOTE — Nurses Notes (Signed)
[name] requested and received PRN Vistaril for itching.   Jerrilyn Cairo, RN

## 2022-08-15 NOTE — Nurses Notes (Signed)
Jasmine [name] reequested and received PRN Vistaril for anxiety.  Jerrilyn Cairo, RN

## 2022-08-15 NOTE — Group Note (Signed)
Group topic:  GOALS/AFFIRMATIONS GROUP    Date of group:  08/15/2022  Start time of group:  0720  End time of group:  0800    Attend: [x]   Not attend: []   Attendance:  inpatient attended all of group               Summary of group discussion: Sleep (poor, fair and good), Depression/Anxiety 1-10 (10 being the worst), Suicidal (Plan contracts for safety on the unit No plan), Homicidal( Denies or Yes and towards who), Hallucinations (Visual what you see or Auditory what they are saying), Contracts safety on the unit, Safe to leave, was their goal completed today.       Jasmine [name]  is a 36 y.o. female participating in a goal group.    Patient's mental status/affect:    Patient's behavior:    Patient's response: Patient reports sleep is "Fair", Denies Depression, Anxiety, Suicidal,Homicidal and Hallucinations. Contracts for safety on the unit, Safe on the unit and safe for discharge. Goal- Wants to discharged.    [name], MHS  08/15/2022, 10:18

## 2022-08-15 NOTE — Group Note (Signed)
Group topic:  EVENING REFLECTION / WRAP UP GROUP    Date of group:  08/15/2022  Start time of group:  1950  End time of group:  2003    Attend: [x]   Not attend: []   Attendance:  inpatient attended all of group               Summary of group discussion: ask pt how their day was. Their depression and anxiety levels 1-10 (10 most severe) , having SI/HI- if yes plan. Having any type of hallucinations this evening. Contracts for safety, feels safe on the unit and safe to leave,- did they completed their goal today       Jasmine [name]  is a 36 y.o. female participating in a evening reflection group.    Patient's mental status/affect: wnl    Patient's behavior: calm     Patient's response: reports having an alright day, depression-0/10, anxiety-10/10, No-SI/HI or hallucinations of any, contracts for safety, feels safe on the unit and safe to leave. Goal was not completed     [name], MHS  08/15/2022, 20:05

## 2022-08-15 NOTE — Behavioral Health (Addendum)
PROGRESS NOTE        Telemedicine Documentation: This is a telemedicine note. Patient was treated using telemedicine, real time audio and video  Patient Location: [facility]  Provider Location: [facility]  Patient/family aware of provider location: yes  Patient/family consent for telemedicine: yes  Examination observed and performed by: Aram Beecham provider: Leonette Nutting, MD  -------------------------------------------------------------------------------------------------------  Hospital Day: 10  Start Time: 10:46     CHIEF COMPLAINT:  Inpatient daily psychiatric reevaluation    INTERVAL HISTORY:   Staff reports that she was irritable and agitated.  She is reporting she is "ready to go home."  Had significantly high BP this am, vistaril and then recheck to normal.  Reported a rash and itching.  Taking meds.    Sleep ok  Appetite good  Affect irritable  Side effects denies  Bizzare behaviors none noted  Hallucinatory behaviors none noted      On talking to patient: Patient states that Mood "great"  Anxiety denies    Did apologize to staff for her irritability this AM, understands she will not be leave today.    Reports the rash is "making my ying-yang hurt.      Denies paranoia or delusions.   Affect is expansive.  Denies headaches, nausea/ diarrhea/ constipation. Does not report lightheadedness/ palpitations or postural unsteadiness.   EPS      CURRENT PSYCHIATRIC REVIEW OF SYMPTOMS:  Auditory Hallucinations:  NO  Visual Hallucinations: NO  Paranoid ideation: NO     Homicidal Ideation: NO  Suicidal Ideation: NO     Abnormal and persistent elevated or expansive mood: YES  Abnormal and persistent irritable mood? NO  Increased Goal Directed Activity/Psychomotor agitation?  YES  Decreased need for sleep?  NO  Excessive or rapid speech?  YES, but less  Racing thoughts?  SUSPECT  Distractibility?  YES. But less so  Reckless behavior history?  NO      SOCIAL HISTORY:  Reviewed; no new information provided  today. Please see the Admission note and Social Work documentation.      ALLERGIES   Allergies   Allergen Reactions    Darvocet A500 [Propoxyphene N-Acetaminophen]     Latex     Penicillins           MEDICAL REVIEW OF SYSTEMS  11 ROS reviewed, negative or normal or has otherwise specified in subjective or HPI      VITALS  Blood pressure 105/71, pulse 88, temperature 36.4 C (97.6 F), resp. rate 16, height 1.651 m (5\' 5" ), weight 63.5 kg (140 lb), last menstrual period 08/06/2022, SpO2 99%.    LABS  No results found for this or any previous visit (from the past 24 hour(s)).    SCHEDULED MEDS:    clonazePAM (klonoPIN) tablet 0.5 mg 2x/day    divalproex (DEPAKOTE) 12 hr delayed release tablet 500 mg 2x/day    nicotine (NICODERM CQ) transdermal patch (mg/24 hr) 21 mg Daily    OLANZapine (zyPREXA) tablet 10 mg NIGHTLY    polyethylene glycol (MIRALAX) oral packet 17 g Daily    primidone (MYSOLINE) tablet 50 mg 3x/day    QUEtiapine (SEROquel) tablet 500 mg 500 mg NIGHTLY      MENTAL STATUS EXAMINATION  Appearance:   appears stated age  Level of Consciousness:  Alert   Behavior: Cooperative to the level of ability   Gait: Not tested  Psychomotor:  Restless  Speech:  hyperverbal  Thought Process:  goal directed but disorganized at times  Thought  Content:   no suicidal or homicidal ideation   Affect:  expansive   Mood:  elated  Orientation:  O x 3  Attention:  within normal limits  Concentration:  fair  Memory:  intact  Language:  fluent  Fund of Knowledge/Intellect:  adequate  Abstraction: able to abstract  Insight: fair  Judgment:  fair  Impulse Control:  fair    ----------------------------------------------------------------------------------  -------------------------------------------------------------------------------------  ASSESSMENT: Jasmine [name] is a 36 y.o. female who was admitted with concerns for mania.  She reports the Seroquel has not been helpful.   She has been sexually inappropriate on the unit.        Denied SI/HI, still quite agitated and expansive.        12/26 -  continues to be agitated and sexually preoccupied, minimally better sleep.  Still delusional.    increase Seroquel 300 mg hs     12/27 - expansive, but less agitated.  No longer sexually inappropriate, but overly enthusiastic and minimizing her condition.  She is no longer reporting overt delusions. She did finally sleep through the night.   Will continue increased Seroquel 300 mg hs     12/28 -  no longer agitated, still expansive and hyper verbal.  Mood is still elevated.  She is sleeping well and is no longer sexually preoccupied.  Will continue current meds, but may need to increase Seroquel if her mood does not regulate more by tomorrow.     12/29 - The patient's mood is still quite elevated. She is not agitated . Hyper verbal, expansive.  Nursing has noted she is talking to herself in her room, but this is not happening in the day room.   Will increase Seroquel to 500 mg hs.    She may need a frank mood stabilizer if her mood does not moderate.  Her diagnosis of "tourette's" is new as of the last few months.  There is no prior history.    Will also try to consolidate antipsychotics over the next few days..         12/30 - pt still expansive and elated, pressured and hyper verbal.  Reviewed history again, no h/o manic episodes prior, Tourette's diagnosed 3 weeks ago, no prior history.  She has never been on lithium or Depakote.  Is willing to start Depakote.  Will stop the haldol at this point.        12/31 - still expansive, less so, minimally sexually preoccupied.  No problems with lack of sleep.      If mood regulates with Depakote will plan to taper off Zyprexa and stop.     Is reporting a rash, will have medicine look at this.           DIAGNOSIS:  Unspecified psychotic d/o  R/o bipolar with psychosis      PLAN:  MEDICATIONS:   Seroquel to 500 mg po q hs.  Continue Zyprexa   Start Depakote 500 mg po bid      Depakote level Tuesday AM prior  to first dose.    Continue admission to inpatient    INTERVENTION: Patient continues to require an extensive level of medical decision making due to complexity and acuity of illness and meets criteria for inpatient hospitalization. Discussed status, reviewed option for therapy. Discussed medicine, psychotherapy and structure/schedule of the program. Reviewed the plan, goal, rationale, risks, benefits and alternates. Patient indicated understanding and agreement.      ADDITIONAL RECOMMENDATIONS:   - Case management  following for disposition planning      ELOS: 2-3      -------------------------------------------------------------------------------------      CPT Code:  [id number] Level 1 Hospital Progress Note (if by time, at least 25 min)      Vilinda Flake, MD   _______________________________

## 2022-08-15 NOTE — Care Plan (Signed)
Pt. Is currently resting in bed with eyes closed, respirations regular. Will continue to monitor.   Problem: Adult Behavioral Health Plan of Care  Goal: Patient-Specific Goal (Individualization)  Outcome: Ongoing (see interventions/notes)

## 2022-08-15 NOTE — Group Note (Signed)
Psychoeducational Group Note  Date of group:  08/15/2022  Start time of group:  1000  End time of group:  1050    Attend:  [x]   Not attend:  []   Attendance:  inpatient attended all of group               Summary of group discussion: Patients were asked to roll a giant die, and were asked a question that correspond with the color on the die. The questions were self-esteem related questions, and allowed the patients to discuss self-esteem freely amongst each other.       s a 36 y.o. female participating in a psychoeducational group.    Affect/Mood:  Appropriate    Thought Process:  Logical    Thought Content:  Within normal limits    Interpersonal:  Discussed issues, Attentive, Displayed insight, and Provided feedback    Level of participation:  Full    Comments: Raysor actively participated in this group discussion. She answered all of the questions posed to her, as well as provided feedback to peers. She stated that she believes that the Depakote is helpful, and that she feels much more calm. She is feeling discouraged about not getting to go back to rehab. She was praised for the progress that she has made here, and was encouraged to keep doing what she is doing so that she can improve even more before she gets discharged.     31, [facility] 08/15/2022 11:57

## 2022-08-15 NOTE — Behavioral Health (Signed)
Patient is currently in the day area watching TV and dozing off. She was alittle upset after seeing the doctor because she was seeing others that was here less time than her and she really wanted to leave. Staff got her calmed down. She's been pleasant and social with staff and apologized for being alittle upset earlier. She denies suicidal,homicidal and hallucinations. Safe on the unit and safe for discharge.Ky Barban, MHS

## 2022-08-15 NOTE — Group Note (Signed)
Group topic:  EDUCATION GROUP    Date of group:  08/15/2022  Start time of group:  1400  End time of group:  1500    Attend: [x]   Not Attend:  []   Attendance:  inpatient attended all of group    Summary of group:               Summary of group discussion:   Due to low census Patients were allowed free time to reflect, relax, and reboot.       Jasmine [name]  is a 36 y.o. female participating in a education group.    Patient observations:  Pt chose to lie on the couch and watch TV and relax      Patient goals:  Relax, Reflect and Reboot      [name] S. [name], MHS  08/15/2022, 15:28

## 2022-08-15 NOTE — Group Note (Signed)
Group topic:  RECREATION GROUP    Date of group:  08/15/2022  Start time of group:  1100  End time of group:  1150    Attend:  [x]   Not attend: []   Attendance:  inpatient attended all of group               Summary of group discussion: Per patient request, patients participated in a fuse bead activity. The purpose of this activity was to improve patient mood, increase, socialization, and promote positive relaxation.      Jasmine [name]  is a 36 y.o. female participating in a recreation group.    Patient's mental status/affect: appropriate    Patient's behavior: cooperative    Patient's response: Jasmine [name] actively participated in this group activity with no issues. Therapist witnessed Jasmine [name] smiling and interacting approprietly with peers with no issues.      [name], LPC  08/15/2022, 12:40

## 2022-08-15 NOTE — Care Plan (Signed)
Jasmine [name] is currently in the dayroom watching TV. Dressed appropriately in street clothes. Fair eye contact. She reports she slept "good until seven this morning." Jasmine [name] stated "I need a shower, need a cig, and I am ready to go home." She was irritable and anxious this morning. She did request and receive PRN Vistaril at 0740. She then apologized for being "cranky." She stated that she is home sick and just wants to be discharged. She complained of feeling "itchy." She stated "I have a rash on my butt that is causing my ying yang to hurt." Jasmine [name] did ask this nurse to assess her rash. Upon assessment I did not see any raised rash on the patients buttocks. She did receive PRN benadryl for the itching. Jasmine [name] continues to have some pressured speech. She denies any anxiety or depression. She reports that she feels "better" since starting her medication. She currently denies any side effects. She currently denies any SI, HI, and AVOT. She has been participating in groups. She has been cooperative. Will continue to monitor per protocol.  Jasmine Albee, RN    Problem: Adult Behavioral Health Plan of Care  Goal: Patient-Specific Goal (Individualization)  Outcome: Ongoing (see interventions/notes)  Flowsheets (Taken 08/15/2022 1454)  Patient-Specific Goals (Include Timeframe): "to be discharged"

## 2022-08-15 NOTE — Behavioral Health (Signed)
Pt out on the unit this evening. Pt watched TV.  Dressed in own clothing neat and clean . Pt attends wrap up group. Reported to staff that she was upset today due to being here on new years when she wanted to go home, pt "I'm trying to handle it the best I can, I'm mad upset but i do understand the dr wants to get blood work labs down on me and then i can leave after they check levels", "i do understand this so that way i don't have to come back and start this all over again, now i should call my mama and tell her I'm sorry i did go off and yell at her i was just mad and i shouldn't have been that way" pt is very calm when talking to staff. Pt also told staff she is worried about going back to rehab because " I just want the other girls to like me that's all". Pt is making bed while talking to staff, room is nice and neat, clothing is folded and put up nicely. Pt rates depression 0/10. Anxiety 10/10. No-SI/HI or hallucinations of any, feels safe on the unit and safe to leave.     Braulio Conte, MHS

## 2022-08-15 NOTE — Care Plan (Signed)
The patient spent most of the evening watching TV in the day room.  Her mood was subdued.  Her affect was appropriated.  Her speech was non-pressured and appropriated.  She made no inappropriate body or sexual remarks.  She denies SI, HI and hallucinations.    She appeared to sleep the entire night.      Problem: Adult Behavioral Health Plan of Care  Goal: Patient-Specific Goal (Individualization)  Outcome: Ongoing (see interventions/notes)  Goal: Strengths and Vulnerabilities  Outcome: Ongoing (see interventions/notes)

## 2022-08-15 NOTE — Nurses Notes (Signed)
[name] requested and received PRN Vistaril for anxiety.  Evanna Washinton M. Marieli Rudy, RN

## 2022-08-16 ENCOUNTER — Encounter (HOSPITAL_COMMUNITY): Payer: Self-pay

## 2022-08-16 DIAGNOSIS — N898 Other specified noninflammatory disorders of vagina: Secondary | ICD-10-CM

## 2022-08-16 MED ORDER — FLUCONAZOLE 100 MG TABLET
150.0000 mg | ORAL_TABLET | Freq: Once | ORAL | Status: AC
Start: 2022-08-16 — End: 2022-08-16
  Administered 2022-08-16: 150 mg via ORAL
  Filled 2022-08-16: qty 2

## 2022-08-16 NOTE — Care Management Notes (Signed)
Completed support meeting with SUD nurse, Jasmine [name]. I informed her that Jasmine [name] will be discharged tomorrow. I updated her on her progress. Jasmine [name] is requesting med list be faxed to them so that they can get her medications ordered. Jasmine [name] stated that staff will be here around 1:00 PM to pick Jasmine [name] up. Jasmine [name] denies having any further questions or concerns regarding Jasmine [name]'s discharge.    Jasmine [name], Jasmine [name]

## 2022-08-16 NOTE — Consults (Signed)
Hospitalist Progress Note    SKYLAR FLYNT       37 y.o.       Date of service: 08/16/2022  Date of Admission:  08/06/2022    Hospital Day:  LOS: 10 days       Assessment/Plan:   Active Hospital Problems    Diagnosis    Primary Problem: Involuntary commitment     Vaginal itching: She states this started after antibiotic treatment. She was ordered clotrimazole which helped the outside area but she has continued vaginal itching. Give one time dose of diflucan.  Encouraged patient to establish with PCP           DVT/PE Prophylaxis:  Per attending      Disposition Planning:  Per attending        CC: Involuntary commitment      Subjective: Patient is alert, oriented. States she is having vaginal itching. She declines vaginal exam and states there is nothing there and that the itching is inside. She does state the clotrimazole which was ordered hleped the external itching.  She does report white vaginal discharge.  Patient denies chest pain, SOB, abdominal pain, fever, chills, nausea or vomiting      Medications reviewed.  aluminum-magnesium hydroxide-simethicone (MAG-AL PLUS) 200-200-20 mg per 5 mL oral liquid, 5 mL, Oral, Q4H PRN  clonazePAM (klonoPIN) tablet, 0.5 mg, Oral, 2x/day  cloNIDine (CATAPRES) tablet, 0.1 mg, Oral, 3x/day PRN  diphenhydrAMINE (BENADRYL) capsule, 25 mg, Oral, Q6H PRN  diphenhydrAMINE (BENADRYL) capsule, 25 mg, Oral, HS PRN - MR x 1  divalproex (DEPAKOTE) 12 hr delayed release tablet, 500 mg, Oral, 2x/day  docusate sodium (COLACE) capsule, 100 mg, Oral, 2x/day PRN  hydrOXYzine pamoate (VISTARIL) capsule, 50 mg, Oral, 3x/day PRN  ibuprofen (MOTRIN) tablet, 400 mg, Oral, Q6H PRN  magnesium hydroxide (MILK OF MAGNESIA) 400mg  per 59mL oral liquid, 30 mL, Oral, 2x/day PRN  nicotine (NICODERM CQ) transdermal patch (mg/24 hr), 21 mg, Transdermal, Daily  OLANZapine (zyPREXA) tablet, 10 mg, Oral, 3x/day PRN  OLANZapine (zyPREXA) tablet, 10 mg, Oral, NIGHTLY  polyethylene glycol (MIRALAX) oral packet, 17  g, Oral, Daily  primidone (MYSOLINE) tablet, 50 mg, Oral, 3x/day  promethazine (PHENERGAN) tablet, 25 mg, Oral, Q6H PRN  QUEtiapine (SEROquel) tablet 500 mg, 500 mg, Oral, NIGHTLY        I/O last 24 hours:   No intake or output data in the 24 hours ending 08/16/22 1613      Filed Vitals:    08/14/22 0700 08/15/22 0735 08/15/22 0814 08/16/22 0800   BP: 96/68 (!) 197/133 105/71 (!) 142/76   Pulse: 81 80 88 83   Resp: 17 16  18    Temp: 36 C (96.8 F) 36.4 C (97.6 F)  37.1 C (98.7 F)   SpO2: 98% 99%  100%     I have personally reviewed the vitals.      Physical Exam:    General: anxious, NAD, vitals signs reviewed   HEENT: NCAT, pupils equal, sclerae anicteric, mucus membranes moist    Pulm: CTA bilaterally, no wheeze or rhonchi evident  Cardiac: RRR S1 S2, no murmur  GI: soft, BS x4 normal, non-tender, non-distended   MSK: no edema, normal tone, no deformity    Skin: no rash evident, warm and dry  Neurologic: grossly intact, no focal findings   Psych: normal mood, normal affect   Vascular: equal pulses, grossly normal    Labs: I personally reviewed all labs.   No results found for any  visits on 08/06/22 (from the past 24 hour(s)).    Imaging: I personally reviewed all imaging.   No orders to display       I independently of the faculty provider spent a total of (22) minutes in direct/indirect care of this patient including initial evaluation, review of laboratory, radiology, diagnostic studies, review of medical record, order entry and coordination of care.     Hedy Jacob, NP

## 2022-08-16 NOTE — Behavioral Health (Signed)
Pt is resting in bed. She was up 1x to use the restroom. She went to bed around 930pm. She slept approx 7 hours

## 2022-08-16 NOTE — Nurses Notes (Signed)
[name] requested and received PRN Benadryl for itching.   Jerrilyn Cairo, RN

## 2022-08-16 NOTE — Group Note (Signed)
Group topic:  RECREATION GROUP    Date of group:  08/16/2022  Start time of group:  1300  End time of group:  1430    Attend:  [x]   Not attend: []   Attendance:  inpatient attended all of group               Summary of group discussion:Watched a movie      Jasmine [name]  is a 37 y.o. female participating in a recreation group.    Patient's mental status/affect:    Patient's behavior:    Patient's response: Patient sat and watched the movie with staff. She did go in her room several times      Ky Barban, MHS  08/16/2022, 15:24

## 2022-08-16 NOTE — Care Management Notes (Signed)
Met with pt and completed safety plan, which will be included in AVS.    Lavena Loretto, LPC

## 2022-08-16 NOTE — Behavioral Health (Signed)
Patient is currently in the day area coloring and watching TV. She's been calm and cooperative. She's been pleasant and social with staff. She denies suicidal,homicidal and hallucinations. After seeing the doctor she was excited to get the new she's getting discharged tomorrow. She has been feeling itchy. She stated "I think its my nervous". She's been out of her room all day in the day area coloring or watching TV.  Ky Barban, MHS

## 2022-08-16 NOTE — Group Note (Signed)
Group topic:  GOALS/AFFIRMATIONS GROUP    Date of group:  08/16/2022  Start time of group:  0800  End time of group:  0820    Attend: [x]   Not attend: []   Attendance:  inpatient attended all of group               Summary of group discussion:  Sleep (Poor, Fair, good), Depression, Anxiety 1-10, (10 being the worst), Suicidal with plan or no plan, Homicidal, Hallucinations Auditory and Visual (what you hear or see). Contracts safety on the unit. Goal for the day.      Jasmine [name]  is a 37 y.o. female participating in a goal group.    Patient's mental status/affect:    Patient's behavior:    Patient's response: Patient reports sleep is good, Denies Depression, Anxiety, Suicidal,Homicidal and Hallucinations.  Contracts safety on the unit and safe on the unit and safe to leave. Goal-Talk to Doctor to find out Discharge Date.     Ky Barban, MHS  08/16/2022, 11:34

## 2022-08-16 NOTE — Care Plan (Signed)
The patient spent much of the evening in her room.  She was preoccupied with being discharged tomorrow.  Her mood ranged from being excited about leaving the hospital to angry that her somatic issues were not resolved.  She complained of gynecological itching and dryness.    She appeared to sleep the majority of the night.        Problem: Adult Behavioral Health Plan of Care  Goal: Patient-Specific Goal (Individualization)  Outcome: Ongoing (see interventions/notes)  Goal: Strengths and Vulnerabilities  Outcome: Ongoing (see interventions/notes)

## 2022-08-16 NOTE — Group Note (Signed)
Psychoeducational Group Note  Date of group:  08/16/2022  Start time of group:  1000  End time of group:  25    Attend:  [x]   Not attend:  []   Attendance:  inpatient attended all of group               Summary of group discussion: Patients were guided in a discussion about maintaining stability after discharge. We covered warning signs of poor mental health, coping skills, support, and follow up.      Jasmine [name]  s a 37 y.o. female participating in a psychoeducational group.    Affect/Mood:  Appropriate    Thought Process:  Logical    Thought Content:  Within normal limits    Interpersonal:  Discussed issues, Attentive, Displayed insight, and Provided feedback    Level of participation:  Full    Comments: Durr actively participated in this group discussion. She was able to identify her warning signs as reaching out to old friends who are bad influences, triggers/cravings for drugs, and mood swings. She identified some coping skills that she plans to use as coloring, writing poetry, and pacing/walking. She was able to identify some family members that she can reach out to if she needs to talk to somebody. She identified "limit conversations about substance use" as a way to make her environment safe after discharge, as this topic of conversation is triggering for her. Mirkin identified herself as her reason for living, stating that she is learning to love herself and her positive qualities, such as her loyalty and kindness.     [name], [facility] 08/16/2022 11:53

## 2022-08-16 NOTE — Care Management Notes (Signed)
Pt attended treatment team with Dr. Higinio Plan, nurse Al Corpus, and therapist Lovena Le. She stated that she has achieved her treatment goals of learning patience and acceptance. Reports that she is feeling calm, denies feeling anxious or nervous. States that she is ready for discharge to return to SUD to continues inpatient substance abuse treatment.    Discharge: tomorrow 08/17/2021  Follow up: Neurobehavioral [facility] unit    [name], [facility]

## 2022-08-16 NOTE — Care Plan (Signed)
Jasmine [name] is currently in the dayroom watching TV. Fair eye contact. Flat affect but brightens with interaction. She is social with peers and staff. She rates her depression and anxiety a 0/10. She reports feeling "much better" today. She stated "I feel like the medicine is working." [facility] stated she slept well last night. She currently denies any thoughts of harm to self and others. She denies any hallucinations. She stated she feel safe on the unit and safe for discharge. Jasmine [name]'s speech is less pressured today and she is less anxious. She has been participating in groups. She has been compliant with medications and denies any side effects. Jasmine [name] stated that she is feels hopeful of a future "without drugs." She stated "I wish I never tried them and I never want to go back to using again, this is my goal for 2024." She has been calm and cooperative. Will continue to monitor per protocol.  Jasmine Cairo, RN    Problem: Adult [place] of Care  Goal: Patient-Specific Goal (Individualization)  08/16/2022 0826 by Jasmine [name]., RN  Flowsheets (Taken 08/16/2022 [phone])  Patient-Specific Goals (Include Timeframe): "talk to the doctor and find out a discharge date'     Problem: Psychotic Signs/Symptoms  Goal: Improved Mood Symptoms  Outcome: Ongoing (see interventions/notes)     Problem: Psychotic Signs/Symptoms  Goal: Improved Sleep (Psychotic Signs/Symptoms)  Outcome: Ongoing (see interventions/notes)

## 2022-08-16 NOTE — Group Note (Signed)
Group topic:  EVENING REFLECTION / WRAP UP GROUP    Date of group:  08/16/2022  Start time of group:  0730  End time of group:  0745    Attend: []   Not attend: [x]   Attendance:  inpatient attended 0 minutes               Summary of group discussion:      Jasmine [name]  is a 37 y.o. female participating in a evening reflection group.    Patient's mental status/affect:    Patient's behavior:    Patient's response: pt has been irritable , yelling and cursing out loud in room.     Lailyn Appelbaum L. Jamorion Gomillion, MHS  08/16/2022, 21:07

## 2022-08-16 NOTE — Nurses Notes (Signed)
[name] requested and received PRN Zyprexa for agitation.   Jerrilyn Cairo, RN

## 2022-08-16 NOTE — Care Management Notes (Signed)
Called SUD unit to notify them that pt will be discharged tomorrow. They took down number and stated that they will call back to discuss transportation.    [name], [place]

## 2022-08-16 NOTE — Group Note (Signed)
Group topic:  RECREATION GROUP    Date of group:  08/16/2022  Start time of group:  1100  End time of group:  1130    Attend:  [x]   Not attend: []   Attendance:  inpatient attended all of group               Summary of group discussion: Patients were offered to work on art during this group time. The purpose of this activity was to improve patient mood, increase socialization, promote positive relaxation, and prompt creativity.       Jasmine [name]  is a 37 y.o. female participating in a recreation group.    Patient's mental status/affect: appropriate    Patient's behavior: cooperative    Patient's response: Jasmine [name] actively participated in this activity. She worked on coloring, and showed off some coloring sheets that she completed, as well as some poems that she wrote. Marlin stated that coloring and writing poetry are really the only effective activities that allows her mind to slow down when experiencing racing thoughts. Therapist witnessed Jasmine [name] smiling and interacting appropriately with staff throughout the group.      [name], [place]  08/16/2022, 12:02

## 2022-08-16 NOTE — Nurses Notes (Signed)
[name] requested and received PRN Vistaril for anxiety.  Jerrilyn Cairo, RN

## 2022-08-16 NOTE — Behavioral Health (Signed)
[place]  [place] [address]       Name: Jasmine [name] MRN:  [id number]   Date: 08/06/2022 Age: 37 y.o.       History:        Interval History:    The chart is reviewed, discussed with the nursing staff and patient is interviewed.  Patient attends the treatment team meeting today and discussed the following issues.  1. The goals to achieve for discharge.  2. The discharge plan including the follow-up.  Patient's main goal to achieve is to learn patient has and acceptance.  Patient today has no any agitation and no irritability.  After the treatment team meeting discussed with the patient about her medications and she has no any side effects and that order for Depakote in a.m. tomorrow.  Patient complain of itching and rash around the anal area and happens yesterday.       Review of systems:   Constitutional: Denies any medication side effect.  GI: Denies any diarrhea, constipation.      Current Facility-Administered Medications   Medication Dose Route Frequency Provider Last Rate Last Admin    aluminum-magnesium hydroxide-simethicone (MAG-AL PLUS) 200-200-20 mg per 5 mL oral liquid  5 mL Oral Q4H PRN Jake Seats, MD        clonazePAM (klonoPIN) tablet  0.5 mg Oral 2x/day Jake Seats, MD   0.5 mg at 08/16/22 0754    cloNIDine (CATAPRES) tablet  0.1 mg Oral 3x/day PRN Jake Seats, MD   0.1 mg at 08/08/22 0543    diphenhydrAMINE (BENADRYL) capsule  25 mg Oral Q6H PRN Jake Seats, MD   25 mg at 08/15/22 0936    diphenhydrAMINE (BENADRYL) capsule  25 mg Oral HS PRN - MR x 1 Jake Seats, MD   25 mg at 08/14/22 2033    divalproex (DEPAKOTE) 12 hr delayed release tablet  500 mg Oral 2x/day Vilinda Flake, MD   500 mg at 08/16/22 0754    docusate sodium (COLACE) capsule  100 mg Oral 2x/day PRN Jake Seats, MD   100 mg at 08/16/22 2831    hydrOXYzine pamoate (VISTARIL) capsule  50 mg Oral 3x/day PRN Jake Seats, MD   50 mg at 08/15/22  1806    ibuprofen (MOTRIN) tablet  400 mg Oral Q6H PRN Jake Seats, MD   400 mg at 08/15/22 2031    magnesium hydroxide (MILK OF MAGNESIA) 400mg  per 84mL oral liquid  30 mL Oral 2x/day PRN Jake Seats, MD        nicotine (NICODERM CQ) transdermal patch (mg/24 hr)  21 mg Transdermal Daily Jake Seats, MD   21 mg at 08/16/22 0754    OLANZapine (zyPREXA) tablet  10 mg Oral 3x/day PRN Jake Seats, MD   10 mg at 08/10/22 1629    OLANZapine (zyPREXA) tablet  10 mg Oral NIGHTLY Jake Seats, MD   10 mg at 08/15/22 2031    polyethylene glycol (MIRALAX) oral packet  17 g Oral Daily Leone Payor, NP   17 g at 08/16/22 0754    primidone (MYSOLINE) tablet  50 mg Oral 3x/day Jake Seats, MD   50 mg at 08/16/22 0754    promethazine (PHENERGAN) tablet  25 mg Oral Q6H PRN Jake Seats, MD        QUEtiapine (SEROquel) tablet 500 mg  500 mg Oral NIGHTLY Vilinda Flake, MD   500 mg at 08/15/22 2031  Allergies:   Allergies   Allergen Reactions    Darvocet A500 [Propoxyphene N-Acetaminophen]     Latex     Penicillins          Exam:      BP (!) 142/76   Pulse 83   Temp 37.1 C (98.7 F)   Resp 18   Ht 1.651 m (5\' 5" )   Wt 63.5 kg (140 lb)   LMP 08/06/2022   SpO2 100%   BMI 23.30 kg/m       General appearance:  Well-developed female, in home clothes, adequately, has a fair eye contact, well-nourished, in her stated age.  Behavior:  No psychomotor agitation nor tardive dyskinesia  Musculoskeletal:  Normal gait and muscle stress  Speech:  Normal rate and  Affect:  Full range  Mood:  Stable  Thought process:  Normal  Association:  No delusions verbalized  Abnormal thought:  Denies suicidal thoughts nor homicidal thoughts.  Denies auditory hallucinations nor visual hallucinate  Judgement and Insight:  Fair  Attention and concentration:  Good  Language:  Normal    Diagnosis    ICD-10-CM    1. Involuntary commitment  Z04.6       2. Psychosis (CMS [place])  F29            INTERVENTION: Patient continues to  require an extensive level of medical decision making due to complexity and acuity of illness and meets criteria for inpatient hospitalization. Discussed status, reviewed option for therapy. Discussed medicine, psychotherapy and structure/schedule of the program. Reviewed the plan, goal, rationale, risks, benefits and alternates. Patient indicated understanding and agreement.      Plan:   Continue admission to inpatient:  Depakote level in a.m.  Medications:  Continue clonazepam 0.5 mg b.i.d..  Continue Depakote 500 mg b.i.d..  Continue Zyprexa 10 mg q.a.m. and q.h.s..  Continue Seroquel 500 mg q.h.s.  Case management following for disposition planning  ELOS:  1 day    I spent over 30 minutes on this case of which 65% of the time spent with the patient during the treatment team meeting discussing the goals to achieve and discharge plan and after the treatment team meeting discussing medications and over 50% of the time spent in counseling regarding health issue, substance abuse, medication Education and supportive psychotherapy    CPT:  74259  Jake Seats, MD        This note was partially created using voice recognition software and is inherently subject to errors including those of syntax and "sound-alike" substitutions which may escape proofreading.  In such instances, original meaning may be extrapolated by contextual derivation.

## 2022-08-17 DIAGNOSIS — F315 Bipolar disorder, current episode depressed, severe, with psychotic features: Secondary | ICD-10-CM

## 2022-08-17 LAB — LIPID PANEL
CHOL/HDL RATIO: 2.9
CHOLESTEROL: 132 mg/dL (ref 100–200)
HDL CHOL: 45 mg/dL — ABNORMAL LOW (ref >=50)
LDL CALC: 69 mg/dL (ref <100)
NON-HDL: 87 mg/dL (ref <=190)
TRIGLYCERIDES: 95 mg/dL (ref <150)
VLDL CALC: 14 mg/dL (ref <30)

## 2022-08-17 LAB — HGA1C (HEMOGLOBIN A1C WITH EST AVG GLUCOSE)
ESTIMATED AVERAGE GLUCOSE: 103 mg/dL (ref 0–153)
HEMOGLOBIN A1C: 5.2 % (ref <=7.0)

## 2022-08-17 LAB — VALPROIC ACID LEVEL: VALPROIC ACID LEVEL: 63.9 ug/mL (ref 50.0–100.0)

## 2022-08-17 MED ORDER — CLONIDINE HCL 0.1 MG TABLET
0.1000 mg | ORAL_TABLET | Freq: Three times a day (TID) | ORAL | 0 refills | Status: AC | PRN
Start: 2022-08-17 — End: ?

## 2022-08-17 MED ORDER — CLONAZEPAM 0.5 MG TABLET
0.5000 mg | ORAL_TABLET | Freq: Every day | ORAL | 0 refills | Status: AC
Start: 2022-08-18 — End: ?

## 2022-08-17 MED ORDER — PRIMIDONE 50 MG TABLET
50.0000 mg | ORAL_TABLET | Freq: Three times a day (TID) | ORAL | 0 refills | Status: AC
Start: 2022-08-17 — End: ?

## 2022-08-17 MED ORDER — QUETIAPINE 100 MG TABLET
500.0000 mg | ORAL_TABLET | Freq: Every evening | ORAL | 0 refills | Status: AC
Start: 2022-08-17 — End: ?

## 2022-08-17 MED ORDER — OLANZAPINE 10 MG TABLET
10.0000 mg | ORAL_TABLET | Freq: Two times a day (BID) | ORAL | 0 refills | Status: AC
Start: 2022-08-17 — End: ?

## 2022-08-17 MED ORDER — NICOTINE 21 MG/24 HR DAILY TRANSDERMAL PATCH
21.0000 mg | MEDICATED_PATCH | Freq: Every day | TRANSDERMAL | 0 refills | Status: AC
Start: 2022-08-18 — End: ?

## 2022-08-17 MED ORDER — DIVALPROEX 500 MG TABLET,DELAYED RELEASE
500.0000 mg | DELAYED_RELEASE_TABLET | Freq: Two times a day (BID) | ORAL | 0 refills | Status: AC
Start: 2022-08-17 — End: ?

## 2022-08-17 MED ORDER — HYDROXYZINE PAMOATE 50 MG CAPSULE
50.0000 mg | ORAL_CAPSULE | Freq: Three times a day (TID) | ORAL | 0 refills | Status: AC | PRN
Start: 2022-08-17 — End: ?

## 2022-08-17 MED ORDER — CLONAZEPAM 0.5 MG TABLET
0.5000 mg | ORAL_TABLET | Freq: Every day | ORAL | Status: DC
Start: 2022-08-18 — End: 2022-08-17

## 2022-08-17 NOTE — Care Plan (Signed)
Patient has been out on the unit this morning. Cooperative. Restless. Reports that she is anxious to leave today. Still has complaints of some feminine itching, which has been addressed here on a couple occasions. She denies any suicidal or homicidal thoughts. She denies hallucinations. She does talk to herself at times. When asked about this, she stated that she does this "all the time." She has been compliant with medications. Did receive PRN Motrin this morning, but no other PRN medications. She is social with staff. Does interact with select peers when they are out in the day area. Reports that she is safe on and off the unit. Will continue to monitor. Prepare for discharge.   Problem: Adult Behavioral Health Plan of Care  Goal: Patient-Specific Goal (Individualization)  Outcome: Ongoing (see interventions/notes)     Problem: Psychotic Signs/Symptoms  Goal: Improved Behavioral Control (Psychotic Signs/Symptoms)  Outcome: Ongoing (see interventions/notes)

## 2022-08-17 NOTE — Discharge Instructions (Signed)
None

## 2022-08-17 NOTE — Behavioral Health (Signed)
Pt is resting in bed. She was up and down al night. Slept approx 4 hours

## 2022-08-17 NOTE — Group Note (Signed)
Group topic:  GOALS/AFFIRMATIONS GROUP    Date of group:  08/17/2022  Start time of group:  0730  End time of group:  0800    Attend: [x]   Not attend: []   Attendance:  inpatient attended all of group               Summary of group discussion:  Sleep (Poor, Fair, good), Depression, Anxiety 1-10, (10 being the worst), Suicidal with plan or no plan, Homicidal, Hallucinations Auditory and Visual (what you hear or see). Contracts safety on the unit. Goal for the day.       Jasmine [name]  is a 37 y.o. female participating in a goal group.    Patient's mental status/affect:    Patient's behavior:    Patient's response: Pt reports sleeping good. She denies depression but rates anxiety 10/10. She denies SI/HI/AVI. She feels safe here and to leave. Her goal is to go home.     Heberto Sturdevant Shon Baton, MHS  08/17/2022, 08:21

## 2022-08-17 NOTE — Nurses Notes (Signed)
Involuntary notice of discharge faxed to [facility] and [facility].

## 2022-08-17 NOTE — Group Note (Signed)
Group topic:  RECREATION GROUP    Date of group:  08/17/2022  Start time of group:  1000  End time of group:  1100    Attend:  [x]   Not attend: []   Attendance:  inpatient attended 35 minutes               Summary of group discussion: Pts participated in discussion about using productive craft projects to redirect thoughts for improved coping and the opportunity to utilize the results of those crafts to give gifts to loved ones.       Jasmine [name]  is a 37 y.o. female attended recreation group.    Patient's mental status/affect: appropriate, pleasant    Patient's behavior: appropriate, social    Patient's response: PT participated in discussion and activity and was able to follow instruction without difficulty in order to complete a yarn snow hat ornament. Pt spoke about the people she had been surrounding herself when actively using and how toxic those relationships were. Pt stated she now values people that genuinely care for her and about what happens to her. Pt stated she has no desire to go back to using or to the lifestyle attatched to it. Pt stated she is unsure where she plans to relocate after finishing rehab, but she is sure it will not be [place]. Pt stated "all the people I know there, their whole lives are about dope-cooking it, selling it, using it. I can't be well there."      Sharon Seller, Recreation Therapist  08/17/2022, 11:06

## 2022-08-17 NOTE — Nurses Notes (Signed)
Patient was discharged to SU[facility] at 1105. Her belongings were returned to her. Her discharge packet was printed and explained. She expressed understanding. She declined smoking cessation counseling.

## 2022-08-17 NOTE — Discharge Summary (Signed)
[facility]  DISCHARGE SUMMARY    PATIENT NAME:  Jasmine [name], Jasmine [name]  MRN:  [id number]  DOB:  1986/02/20    ENCOUNTER DATE:  08/06/2022  INPATIENT ADMISSION DATE: 08/06/2022  DISCHARGE DATE:  08/17/2022    ATTENDING PHYSICIAN: Jake Seats, MD  SERVICE: [facility]  PRIMARY CARE PHYSICIAN: Aldean Jewett, MD       No lay caregiver identified.    PRIMARY DISCHARGE DIAGNOSIS:   1. Bipolar 1 disorder severe with psychosis.  2.  Methamphetamine use disorder.    3.  Opioid use disorder     Active Hospital Problems    Diagnosis Date Noted    Principal Problem: Involuntary commitment [Z04.6] 08/06/2022      Resolved Hospital Problems   No resolved problems to display.     There are no active non-hospital problems to display for this patient.          Current Discharge Medication List        START taking these medications.        Details   divalproex 500 mg Tablet, Delayed Release (E.C.)  Commonly known as: DEPAKOTE   500 mg, Oral, 2 TIMES DAILY  Qty: 60 Tablet  Refills: 0     nicotine 21 mg/24 hr Patch 24 hr  Commonly known as: NICODERM CQ  Start taking on: August 18, 2022   21 mg, Transdermal, DAILY  Qty: 30 Patch  Refills: 0     OLANZapine 10 mg Tablet  Commonly known as: zyPREXA   10 mg, Oral, 2 TIMES DAILY  Qty: 60 Tablet  Refills: 0            CONTINUE these medications which have CHANGED during your visit.        Details   clonazePAM 0.5 mg Tablet  Commonly known as: klonoPIN  Start taking on: August 18, 2022  What changed:   when to take this  additional instructions  Another medication with the same name was removed. Continue taking this medication, and follow the directions you see here.   0.5 mg, Oral, DAILY  Qty: 15 Tablet  Refills: 0     QUEtiapine 100 mg Tablet  Commonly known as: SEROquel  What changed:   how much to take  when to take this  additional instructions  Another medication with the same name was removed. Continue taking this medication, and follow the directions you see here.   500 mg,  Oral, NIGHTLY  Qty: 150 Tablet  Refills: 0            CONTINUE these medications - NO CHANGES were made during your visit.        Details   cloNIDine HCL 0.1 mg Tablet  Commonly known as: CATAPRES   0.1 mg, Oral, 3 TIMES DAILY PRN  Qty: 60 Tablet  Refills: 0     hydrOXYzine pamoate 50 mg Capsule  Commonly known as: VISTARIL   50 mg, Oral, 3 TIMES DAILY PRN  Qty: 60 Capsule  Refills: 0     primidone 50 mg Tablet  Commonly known as: MYSOLINE   50 mg, Oral, 3 TIMES DAILY  Qty: 90 Tablet  Refills: 0            STOP taking these medications.      cyclobenzaprine 10 mg Tablet  Commonly known as: FLEXERIL     Intuniv 1 mg Tablet Sustained Release 24 hr  Generic drug: guanFACINE     Non-Formulary/Special Preparation  Commonly known as: MEDICATION HELP     OXcarbazepine 300 mg Tablet  Commonly known as: TRILEPTAL            Discharge med list refreshed?  YES     Allergies   Allergen Reactions    Darvocet A500 [Propoxyphene N-Acetaminophen]     Latex     Penicillins      HOSPITAL PROCEDURE(S):   No orders of the defined types were placed in this encounter.      REASON FOR HOSPITALIZATION AND HOSPITAL COURSE   BRIEF HPI:  This is a 37 y.o., female admitted for psychotic symptoms and suicidal thoughts.  This is a 37 year old female with past psych history of hospitalization longstanding history of bipolar disorder and polysubstance abuse and dependence who has been hearing voices and having suicidal thoughts.  Patient treated for depression substance abuse [place] and moved to [facility] 13 months ago and she has been treatment at [facility] and was diagnosed with Tourette syndrome at [facility] at age 74 years old.  Patient went to SUDs program in [employer] and patient started to have bizarre behavior and it is getting worse and she was scooting her button across the floor and she had outburst of anger and cussing and verbalize suicidal thoughts and patient was brought to the ER and was admitted  involuntary.  Patient was verbally abusive to the staff and the SUDss program and patient was unstable and very threatening and had an argument with the staff and was brought today ER for evaluation and treatment.  Patient was very manic and she had pressured speech and very loud and has some abnormal movement.  Patient has been using drugs she has a long history of methamphetamine abuse and opiate dependence .  Patient denies any withdrawal./  BRIEF HOSPITAL NARRATIVE:     This is a 37 year old female was admitted under my service and discussed treatment plan including psychotherapy and pharmacotherapy and that substance abuse counseling.  Patient continue with her medications Trileptal 300 mg b.i.d. the Mysoline 50 mg t.i.d. and Seroquel 100 mg q.a.m. and 200 mg q.h.s. and continued with the clonidine 0.1 mg t.i.d. and Klonopin 0.5 mg b.i.d. patient was extremely psychotic and agitated at times and she started on Zyprexa 10 mg t.i.d. p.r.n. on December 24th and she started on Haldol 1 mg daily for Tourette's and she started on Zyprexa 10 mg q.h.s. in addition to the Seroquel patient was seen by the hospitalist on December 24th because of vaginal itching and patient started on Flagyl.  On December 26.  Patient continued to be sexually preoccupied and with poor sleep and was continued to be delusional and her Seroquel increased to 300 mg q.h.s..  On December 27 patient continued to be psychotic but no longer sexually preoccupied.  On December 28 patient was no longer agitated and on December 29 patient mood was elevated and they and Seroquel was increased to 500 mg q.h.s..  On December 30 patient was having no any manic symptoms and she was not control and Haldol was discontinued and she started on Depakote 500 mg .  On January 1st patient attended the treatment team meeting and discussed the goals to achieve and patient seeming to be stabilized and patient was able to contract for safety and patient discharged on  August 17, 2022 with the follow-up at the suds program.  Reviewed with the patient about her medication discharge and also reviewed with the patient about her Depakote level/.  TRANSITION/POST DISCHARGE CARE/PENDING TESTS/REFERRALS:  Depakote level was done and level was 63.9.  Lipid profile and hemoglobin A1c was done today and are within normal limits    CONDITION ON DISCHARGE:  A. Ambulation: Full ambulation  B. Self-care Ability: Complete  C. Cognitive Status Oriented x 3  D. Code status at discharge:       LINES/DRAINS/WOUNDS AT DISCHARGE:   Patient Lines/Drains/Airways Status       Active Line / Dialysis Catheter / Dialysis Graft / Drain / Airway / Wound       None                    DISCHARGE DISPOSITION:  Home discharge  DISCHARGE INSTRUCTIONS:  Post-Discharge Follow Up Appointments       Go to [facility] in 1 day(s)    Phone: [phone]    Where: [address] Vivianne Master [facility] [address]      Tuesday Aug 17, 2022    Go to SUD Unit          No discharge procedures on file.    I spent over 35 minutes on this case of which 65% of the time spent with the patient and over 50% of the time spent in counseling regarding substance abuse, medication Education and supportive psychotherapy     CPT code:  41324  Jake Seats, MD    Copies sent to Care Team         Relationship Specialty Notifications Start End    Aldean Jewett, MD PCP - [place]  08/06/22     Phone: [phone] Fax: [phone]         [place] [id number]            Referring providers can utilize [email or web address] to access their referred [place] patient's information.

## 2022-08-18 NOTE — Care Management Notes (Signed)
Faxed discharge summary to [facility]al [facility] Unit. Received fax confirmation.     Jasmine [name], [name]

## 2022-10-05 ENCOUNTER — Other Ambulatory Visit: Payer: Self-pay

## 2022-10-05 ENCOUNTER — Other Ambulatory Visit: Payer: 59 | Attending: PSYCHIATRY AND NEUROLOGY-NEUROLOGY

## 2022-10-05 DIAGNOSIS — F1120 Opioid dependence, uncomplicated: Secondary | ICD-10-CM | POA: Insufficient documentation

## 2022-10-05 DIAGNOSIS — F1520 Other stimulant dependence, uncomplicated: Secondary | ICD-10-CM | POA: Insufficient documentation

## 2022-10-05 DIAGNOSIS — F3164 Bipolar disorder, current episode mixed, severe, with psychotic features: Secondary | ICD-10-CM | POA: Insufficient documentation

## 2022-10-05 DIAGNOSIS — F1220 Cannabis dependence, uncomplicated: Secondary | ICD-10-CM | POA: Insufficient documentation

## 2022-10-05 LAB — COMPREHENSIVE METABOLIC PANEL, NON-FASTING
ALBUMIN/GLOBULIN RATIO: 1.9 (ref 1.5–2.5)
ALBUMIN: 4.5 g/dL (ref 3.5–5.0)
ALKALINE PHOSPHATASE: 59 U/L (ref 38–126)
ALT (SGPT): 35 U/L — ABNORMAL HIGH (ref <35)
ANION GAP: 13 mmol/L (ref 5–19)
AST (SGOT): 34 U/L (ref 14–36)
BILIRUBIN TOTAL: 0.2 mg/dL (ref 0.2–1.3)
BUN/CREA RATIO: 32 — ABNORMAL HIGH (ref 6–20)
BUN: 25 mg/dL — ABNORMAL HIGH (ref 7–17)
CALCIUM: 9.7 mg/dL (ref 8.4–10.2)
CHLORIDE: 103 mmol/L (ref 98–107)
CO2 TOTAL: 23 mmol/L (ref 22–30)
CREATININE: 0.77 mg/dL (ref 0.52–1.00)
ESTIMATED GFR: >60 mL/min/{1.73_m2} (ref >60)
GLUCOSE: 130 mg/dL — ABNORMAL HIGH (ref 74–106)
POTASSIUM: 4.3 mmol/L (ref 3.5–5.1)
PROTEIN TOTAL: 6.9 g/dL (ref 6.3–8.2)
SODIUM: 139 mmol/L (ref 137–145)

## 2022-10-05 LAB — CBC WITH DIFF
BASOPHIL #: 0 10*3/uL (ref 0.00–0.20)
BASOPHIL %: 0 % (ref 0–2)
EOSINOPHIL #: 0.2 10*3/uL (ref 0.00–0.60)
EOSINOPHIL %: 3 % (ref 0–5)
HCT: 33.4 % — ABNORMAL LOW (ref 36.0–48.0)
HGB: 11.4 g/dL — ABNORMAL LOW (ref 11.6–14.8)
LYMPHOCYTE #: 1.1 10*3/uL (ref 1.10–3.80)
LYMPHOCYTE %: 18 % — ABNORMAL LOW (ref 19–46)
MCH: 29 pg (ref 24.4–34.0)
MCHC: 34.1 g/dL (ref 30.0–37.0)
MCV: 84.8 fL (ref 80.0–100.0)
MONOCYTE #: 0.4 10*3/uL (ref 0.10–0.80)
MONOCYTE %: 7 % (ref 4–12)
MPV: 9.2 fL (ref 7.5–11.5)
NEUTROPHIL #: 4.4 10*3/uL (ref 1.80–7.50)
NEUTROPHIL %: 72 % — ABNORMAL HIGH (ref 41–69)
PLATELETS: 198 10*3/uL (ref 130–400)
RBC: 3.94 10*6/uL (ref 3.50–5.50)
RDW: 15.1 % — ABNORMAL HIGH (ref 11.5–14.0)
WBC: 6 10*3/uL (ref 4.5–11.5)

## 2022-10-05 LAB — LIPID PANEL
CHOLESTEROL: 171 mg/dL (ref 0–200)
HDL CHOL: 70 mg/dL (ref >40)
LDL CALC: 78 mg/dL (ref <100)
TRIGLYCERIDES: 114 mg/dL (ref <150)

## 2022-10-05 LAB — HEPATITIS A (HAV) IGM ANTIBODY: HAV IGM: NEGATIVE

## 2022-10-05 LAB — VALPROIC ACID LEVEL: VALPROIC ACID LEVEL: 66.3 ug/mL (ref 50.0–120.0)

## 2022-10-05 LAB — HEPATITIS B CORE IGM, AB: HBV CORE IGM ANTIBODY QUALITATIVE: NEGATIVE

## 2022-10-05 LAB — HGA1C (HEMOGLOBIN A1C WITH EST AVG GLUCOSE)
ESTIMATED AVERAGE GLUCOSE: 100 mg/dL
HEMOGLOBIN A1C: 5.1 % (ref 4.0–6.0)

## 2022-10-05 LAB — HEPATITIS C ANTIBODY SCREEN WITH REFLEX TO HCV PCR: HCV ANTIBODY QUALITATIVE: REACTIVE — AB

## 2022-10-05 LAB — HEPATITIS B SURFACE ANTIGEN: HBV SURFACE ANTIGEN QUALITATIVE: NEGATIVE

## 2022-10-05 LAB — THYROID STIMULATING HORMONE (SENSITIVE TSH): TSH: 3.9 u[IU]/mL (ref 0.465–4.680)

## 2022-10-05 LAB — THYROXINE, TOTAL T4: T4 TOTAL: 6 ug/dL (ref 5.5–11.0)

## 2022-10-05 LAB — HCG QUALITATIVE PREGNANCY, SERUM: PREGNANCY, SERUM QUALITATIVE: NEGATIVE

## 2022-10-05 LAB — HIV1/HIV2 SCREEN, COMBINED ANTIGEN AND ANTIBODY: HIV SCREEN, COMBINED ANTIGEN & ANTIBODY: NEGATIVE

## 2022-10-05 LAB — TRIIODOTHYRONINE, TOTAL (TOTAL T3): T3 TOTAL: 132 ng/dL (ref 97–169)

## 2022-10-06 LAB — HEPATITIS C VIRUS (HCV) RNA DETECTION AND QUANTIFICATION, PCR, PLASMA: HCV QUANTITATIVE PCR: NOT DETECTED

## 2022-10-11 ENCOUNTER — Other Ambulatory Visit: Payer: Self-pay

## 2022-10-11 ENCOUNTER — Emergency Department
Admission: EM | Admit: 2022-10-11 | Discharge: 2022-10-11 | Disposition: A | Discharge status: Home / Self Care | Payer: 59 | Attending: Sports Medicine | Admitting: Sports Medicine

## 2022-10-11 ENCOUNTER — Encounter (HOSPITAL_COMMUNITY): Payer: Self-pay

## 2022-10-11 ENCOUNTER — Emergency Department (HOSPITAL_COMMUNITY)
Admission: RE | Admit: 2022-10-11 | Discharge: 2022-10-11 | Disposition: A | Discharge status: Home / Self Care | Payer: 59 | Source: Ambulatory Visit

## 2022-10-11 DIAGNOSIS — F17210 Nicotine dependence, cigarettes, uncomplicated: Secondary | ICD-10-CM

## 2022-10-11 DIAGNOSIS — R0789 Other chest pain: Secondary | ICD-10-CM | POA: Insufficient documentation

## 2022-10-11 DIAGNOSIS — F1510 Other stimulant abuse, uncomplicated: Secondary | ICD-10-CM | POA: Insufficient documentation

## 2022-10-11 LAB — CBC WITH DIFF
BASOPHIL #: 0 10*3/uL (ref 0.00–0.20)
BASOPHIL %: 0 % (ref 0–2)
EOSINOPHIL #: 0.1 10*3/uL (ref 0.00–0.60)
EOSINOPHIL %: 3 % (ref 0–5)
HCT: 29.7 % — ABNORMAL LOW (ref 36.0–48.0)
HGB: 10.1 g/dL — ABNORMAL LOW (ref 11.6–14.8)
LYMPHOCYTE #: 1 10*3/uL — ABNORMAL LOW (ref 1.10–3.80)
LYMPHOCYTE %: 32 % (ref 19–46)
MCH: 28.9 pg (ref 24.4–34.0)
MCHC: 34.1 g/dL (ref 30.0–37.0)
MCV: 84.8 fL (ref 80.0–100.0)
MONOCYTE #: 0.3 10*3/uL (ref 0.10–0.80)
MONOCYTE %: 9 % (ref 4–12)
MPV: 9.2 fL (ref 7.5–11.5)
NEUTROPHIL #: 1.8 10*3/uL (ref 1.80–7.50)
NEUTROPHIL %: 56 % (ref 41–69)
PLATELETS: 207 10*3/uL (ref 130–400)
RBC: 3.51 10*6/uL (ref 3.50–5.50)
RDW: 16 % — ABNORMAL HIGH (ref 11.5–14.0)
WBC: 3.2 10*3/uL — ABNORMAL LOW (ref 4.5–11.5)

## 2022-10-11 LAB — COMPREHENSIVE METABOLIC PANEL, NON-FASTING
ALBUMIN/GLOBULIN RATIO: 1.7 (ref 1.5–2.5)
ALBUMIN: 4.5 g/dL (ref 3.5–5.0)
ALKALINE PHOSPHATASE: 27 U/L — ABNORMAL LOW (ref 38–126)
ALT (SGPT): 15 U/L (ref <35)
ANION GAP: 11 mmol/L (ref 5–19)
AST (SGOT): 46 U/L — ABNORMAL HIGH (ref 14–36)
BILIRUBIN TOTAL: 1.1 mg/dL (ref 0.2–1.3)
BUN/CREA RATIO: 30 — ABNORMAL HIGH (ref 6–20)
BUN: 20 mg/dL — ABNORMAL HIGH (ref 7–17)
CALCIUM: 9.2 mg/dL (ref 8.4–10.2)
CHLORIDE: 108 mmol/L — ABNORMAL HIGH (ref 98–107)
CO2 TOTAL: 18 mmol/L — ABNORMAL LOW (ref 22–30)
CREATININE: 0.67 mg/dL (ref 0.52–1.00)
ESTIMATED GFR: >60 mL/min/{1.73_m2} (ref >60)
GLUCOSE: 91 mg/dL (ref 74–106)
POTASSIUM: 5.2 mmol/L — ABNORMAL HIGH (ref 3.5–5.1)
PROTEIN TOTAL: 7.2 g/dL (ref 6.3–8.2)
SODIUM: 137 mmol/L (ref 137–145)

## 2022-10-11 LAB — MAGNESIUM: MAGNESIUM: 1.5 mg/dL — ABNORMAL LOW (ref 1.6–2.3)

## 2022-10-11 LAB — ECG 12 LEAD
Calculated P Axis: 72 degrees
Calculated R Axis: 74 degrees
Calculated T Axis: 72 degrees
PR Interval: 138 ms
QRS Duration: 76 ms
QT Interval: 404 ms
QTC Calculation: 413 ms
Ventricular rate: 64 {beats}/min

## 2022-10-11 LAB — TROPONIN-I: TROPONIN I: 0.02 ng/mL (ref 0.00–0.03)

## 2022-10-11 MED ORDER — MAGNESIUM SULFATE 1 GRAM/100 ML IN DEXTROSE 5 % INTRAVENOUS PIGGYBACK
1.0000 g | INJECTION | Freq: Once | INTRAVENOUS | Status: AC
Start: 2022-10-11 — End: 2022-10-11
  Administered 2022-10-11: 1 g via INTRAVENOUS
  Administered 2022-10-11: 0 g via INTRAVENOUS
  Filled 2022-10-11: qty 100

## 2022-10-11 MED ORDER — SODIUM CHLORIDE 0.9 % IV BOLUS
1000.0000 mL | INJECTION | Status: AC
Start: 2022-10-11 — End: 2022-10-11
  Administered 2022-10-11: 0 mL via INTRAVENOUS
  Administered 2022-10-11: 1000 mL via INTRAVENOUS

## 2022-10-11 NOTE — ED Provider Notes (Signed)
Name: Jasmine [name]  Age and Gender: 37 y.o. female  Date of Birth: 04/13/1986  Date of Service: 10/11/2022   MRN: [id number]  PCP: Aldean Jewett, MD    Chief Complaint   Patient presents with    Chest Pain        HPI:    Jasmine [name] is a 37 y.o. female presenting with chest pain.    Around Thursday, 2/22, the patient reports that she began having chest pain, which worsened this morning around 2 am. Since Thursday she reports that she has had no relief. She states that she has never had chest pain before this instance. 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 Patient endorsed smoking cigarettes. Denies the use of alcohol. Patient denies cough, congestion, palpations, fever, body aches/chills, diarrhea, nausea/vomiting, or any other worrisome symptom.       ROS:  [name] systems reviewed and are negative, unless stated in the HPI.      Below pertinent information reviewed with patient and/or EMR:  Past Medical History:   Diagnosis Date    Epileptic seizure (CMS [place])     Tourette's      Medications Prior to Admission       Prescriptions    clonazePAM (KLONOPIN) 0.5 mg Oral Tablet    Take 1 Tablet (0.5 mg total) by mouth Once a day    cloNIDine HCL (CATAPRES) 0.1 mg Oral Tablet    Take 1 Tablet (0.1 mg total) by mouth Three times a day as needed    divalproex (DEPAKOTE) 500 mg Oral Tablet, Delayed Release (E.C.)    Take 1 Tablet (500 mg total) by mouth Twice daily    hydrOXYzine pamoate (VISTARIL) 50 mg Oral Capsule    Take 1 Capsule (50 mg total) by mouth Three times a day as needed for Anxiety    nicotine (NICODERM CQ) 21 mg/24 hr Transdermal Patch 24 hr    Place 1 Patch (21 mg total) on the skin Once a day    OLANZapine (ZYPREXA) 10 mg Oral Tablet    Take 1 Tablet (10 mg total) by mouth Twice daily    primidone (MYSOLINE) 50 mg Oral Tablet    Take 1 Tablet (50 mg total) by mouth Three times a day    QUEtiapine (SEROQUEL) 100 mg Oral Tablet    Take 5 Tablets (500 mg total) by  mouth Every night          Allergies   Allergen Reactions    Darvocet A500 [Propoxyphene N-Acetaminophen]     Latex     Penicillins      History reviewed. No pertinent surgical history.  Family Medical History:    None       Social History     Tobacco Use    Smoking status: Every Day     Types: Cigarettes    Smokeless tobacco: Never   Substance Use Topics    Alcohol use: Never    Drug use: Yes     Types: Methamphetamines       Objective:  ED Triage Vitals   BP    Pulse    Resp    Temp    SpO2    Weight    Height      Nursing notes and vital signs reviewed.    Constitutional:  No acute distress.  Alert and oriented to person, place, and time.   HENT:   Head:  Normocephalic and atraumatic.   Mouth/Throat: Oropharynx is clear and moist.   Eyes: EOMI, PERRL   Neck: Trachea midline. Neck supple.  Cardiovascular: RRR, No murmurs, rubs or gallops. Intact distal pulses.  Pulmonary/Chest: BS equal bilaterally. No respiratory distress. No wheezes, rales or chest tenderness.  No tachypnea, retractions or accessory muscle use.  Abdominal: BS +. Abdomen soft, no tenderness, rebound or guarding.  Back: No midline spinal tenderness, no paraspinal tenderness, no CVA tenderness.           Musculoskeletal: No edema, tenderness or deformity.  Strength 5/5 in [name] extremities  Skin: warm and dry. No rash, erythema, pallor or cyanosis  Psychiatric: normal mood and affect. Behavior is normal.   Neurological: Patient keenly alert and responsive, CN II-XII grossly intact, moving [name] extremities equally and fully, normal gait    Labs:   Labs Ordered/Reviewed   COMPREHENSIVE METABOLIC PANEL, NON-FASTING - Abnormal; Notable for the following components:       Result Value    POTASSIUM 5.2 (*)     CHLORIDE 108 (*)     CO2 TOTAL 18 (*)     BUN 20 (*)     BUN/CREA RATIO 30 (*)     ALKALINE PHOSPHATASE 27 (*)     AST (SGOT) 46 (*)     [name] other components within normal limits    Narrative:     Estimated Glomerular Filtration Rate (eGFR) is  calculated using the CKD-EPI (2021) equation, intended for patients 64 years of age and older. If gender is not documented or "unknown", there will be no eGFR calculation.   MAGNESIUM - Abnormal; Notable for the following components:    MAGNESIUM 1.5 (*)     [name] other components within normal limits   CBC WITH DIFF - Abnormal; Notable for the following components:    WBC 3.2 (*)     HGB 10.1 (*)     HCT 29.7 (*)     RDW 16.0 (*)     LYMPHOCYTE # 1.00 (*)     [name] other components within normal limits   TROPONIN-I - Normal   CBC/DIFF    Narrative:     The following orders were created for panel order CBC/DIFF.  Procedure                               Abnormality         Status                     ---------                               -----------         ------                     CBC WITH [id number]                Abnormal            Final result                 Please view results for these tests on the individual orders.       Imaging:  XR AP MOBILE CHEST   Final Result by Edi, Radresults In (02/26 1217)   NO ACUTE FINDINGS.  Radiologist location ID: [id number]             EKG:  Normal sinus rhythm no acute ST T wave changes.    MDM/Course:  Patient seen and evaluated here in the emergency room.  She was in no distress.  Her testing was relatively unremarkable.  She may have been slightly dehydrated and her magnesium levels were low.  She was treated for the low magnesium as well as some IV fluids.  I told her to continue to monitor symptoms.  She was feeling much better at her time of discharge.  I do think that some of her symptoms may be secondary to her still detoxing off of the methamphetamines.  She can return here as needed.           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        Details   clonazePAM 0.5 mg Tablet  Commonly known as: klonoPIN   0.5 mg, Oral, DAILY  Qty: 15 Tablet  Refills: 0     cloNIDine HCL 0.1 mg Tablet  Commonly known as: CATAPRES   0.1 mg, Oral, 3 TIMES DAILY PRN  Qty: 60 Tablet  Refills: 0     divalproex 500 mg Tablet, Delayed Release (E.C.)  Commonly known as: DEPAKOTE   500 mg, Oral, 2 TIMES DAILY  Qty: 60 Tablet  Refills: 0     hydrOXYzine pamoate 50 mg Capsule  Commonly known as: VISTARIL   50 mg, Oral, 3 TIMES DAILY PRN  Qty: 60 Capsule  Refills: 0     nicotine 21 mg/24 hr Patch 24 hr  Commonly known as: NICODERM CQ   21 mg, Transdermal, DAILY  Qty: 30 Patch  Refills: 0     OLANZapine 10 mg Tablet  Commonly known as: zyPREXA   10 mg, Oral, 2 TIMES DAILY  Qty: 60 Tablet  Refills: 0     primidone 50 mg Tablet  Commonly known as: MYSOLINE   50 mg, Oral, 3 TIMES DAILY  Qty: 90 Tablet  Refills: 0     QUEtiapine 100 mg Tablet  Commonly known as: SEROquel   500 mg, Oral, NIGHTLY  Qty: 150 Tablet  Refills: 0              Follow up:    Aldean Jewett, MD  [address]  [address] [place] [address]  [phone]            Parts of this patients chart were completed in a retrospective fashion due to simultaneous direct patient care activities in the Emergency Department.   This note was partially generated using MModal Fluency Direct system, and there may be some incorrect words, spellings, and punctuation that were not noted in checking the note before saving.      I am scribing for, and in the presence of, Dr. Andree Elk for services provided on 10/11/2022.  Rosana Hoes, SCRIBE 10/11/2022, 12:00    I personally performed the services described in this documentation, as scribed  in my presence, and it is both accurate  and complete.    Karl Pock, MD

## 2022-10-11 NOTE — ED Triage Notes (Signed)
Pt reports to the ED complaining of cp. Pt reports cp since yesterday but states that it worsened around 2AM this morning. Pt reports Sob also.

## 2022-10-11 NOTE — ED Nurses Note (Signed)
Pt discharged home. Verbalizes understanding of d/c instructions, medications, and follow up. Pt offers no questions or complaints and ambulates to exit with no assistance. Rn calls pt rehab facility for ride. Pt discharged to waiting room

## 2022-10-15 ENCOUNTER — Emergency Department
Admission: EM | Admit: 2022-10-15 | Discharge: 2022-10-15 | Disposition: A | Discharge status: Home / Self Care | Payer: 59 | Attending: Student in an Organized Health Care Education/Training Program | Admitting: Student in an Organized Health Care Education/Training Program

## 2022-10-15 ENCOUNTER — Other Ambulatory Visit: Payer: Self-pay

## 2022-10-15 ENCOUNTER — Emergency Department (HOSPITAL_COMMUNITY): Payer: 59

## 2022-10-15 ENCOUNTER — Encounter (HOSPITAL_COMMUNITY): Payer: Self-pay

## 2022-10-15 DIAGNOSIS — R1032 Left lower quadrant pain: Secondary | ICD-10-CM

## 2022-10-15 DIAGNOSIS — K5900 Constipation, unspecified: Secondary | ICD-10-CM

## 2022-10-15 DIAGNOSIS — Z3200 Encounter for pregnancy test, result unknown: Secondary | ICD-10-CM | POA: Insufficient documentation

## 2022-10-15 HISTORY — DX: Bipolar disorder, unspecified (CMS HCC): F31.9

## 2022-10-15 LAB — CBC WITH DIFF
BASOPHIL #: 0 10*3/uL (ref 0.00–0.30)
BASOPHIL %: 0 % (ref 0–1)
EOSINOPHIL #: 0.1 10*3/uL (ref 0.00–0.50)
EOSINOPHIL %: 2 % (ref 0–4)
HCT: 34.2 % — ABNORMAL LOW (ref 37.0–47.0)
HGB: 11.6 g/dL — ABNORMAL LOW (ref 12.5–16.0)
LYMPHOCYTE #: 1.3 10*3/uL (ref 0.90–4.80)
LYMPHOCYTE %: 25 % (ref 23–35)
MCH: 28.5 pg (ref 28.0–33.0)
MCHC: 33.8 g/dL (ref 32.0–37.0)
MCV: 84.5 fL (ref 78.0–100.0)
MONOCYTE #: 0.4 10*3/uL (ref 0.30–0.90)
MONOCYTE %: 8 % (ref 0–12)
NEUTROPHIL #: 3.3 10*3/uL (ref 1.70–7.00)
NEUTROPHIL %: 64 % (ref 50–70)
PLATELETS: 211 10*3/uL (ref 130–400)
RBC: 4.05 10*6/uL — ABNORMAL LOW (ref 4.20–5.40)
RDW: 15.6 % (ref 9.9–16.5)
WBC: 5.1 10*3/uL (ref 4.5–11.0)

## 2022-10-15 LAB — URINALYSIS, MICROSCOPIC

## 2022-10-15 LAB — URINALYSIS, MACROSCOPIC
BILIRUBIN: NEGATIVE mg/dL
BLOOD: NEGATIVE mg/dL
GLUCOSE: NEGATIVE mg/dL
KETONES: NEGATIVE mg/dL
LEUKOCYTES: NEGATIVE WBCs/uL
NITRITE: NEGATIVE
PH: 7 (ref 5.0–8.0)
PROTEIN: NEGATIVE mg/dL
SPECIFIC GRAVITY: 1.014 (ref 1.002–1.030)
UROBILINOGEN: <2 mg/dL (ref <2.0)

## 2022-10-15 LAB — COMPREHENSIVE METABOLIC PANEL, NON-FASTING
ALBUMIN: 4.1 g/dL (ref 3.5–5.0)
ALKALINE PHOSPHATASE: 60 U/L (ref 40–110)
ALT (SGPT): 9 U/L (ref 8–22)
ANION GAP: 9 mmol/L (ref 4–13)
AST (SGOT): 21 U/L (ref 8–45)
BILIRUBIN TOTAL: 0.1 mg/dL — ABNORMAL LOW (ref 0.3–1.3)
BUN/CREA RATIO: 33 — ABNORMAL HIGH (ref 6–22)
BUN: 23 mg/dL (ref 8–25)
CALCIUM: 9.4 mg/dL (ref 8.6–10.2)
CHLORIDE: 103 mmol/L (ref 96–111)
CO2 TOTAL: 25 mmol/L (ref 22–30)
CREATININE: 0.7 mg/dL (ref 0.60–1.05)
ESTIMATED GFR - FEMALE: >90 mL/min/BSA (ref >=60)
GLUCOSE: 90 mg/dL (ref 65–125)
POTASSIUM: 4.7 mmol/L (ref 3.5–5.1)
PROTEIN TOTAL: 7.6 g/dL (ref 6.4–8.3)
SODIUM: 137 mmol/L (ref 136–145)

## 2022-10-15 LAB — HCG QUALITATIVE PREGNANCY, SERUM: PREGNANCY, SERUM QUALITATIVE: NEGATIVE

## 2022-10-15 LAB — LACTIC ACID LEVEL W/ REFLEX FOR LEVEL >2.0: LACTIC ACID: 0.9 mmol/L (ref 0.5–2.2)

## 2022-10-15 LAB — LIPASE: LIPASE: 20 U/L (ref 10–60)

## 2022-10-15 NOTE — ED Triage Notes (Signed)
Patient arrives EMS from [facility] for constipation. Patient complains of lower abdominal pain. Last BM normal-2 weeks ago. Patient states that she has only had hard small stool since then and she had to dig that out.

## 2022-10-15 NOTE — ED Nurses Note (Signed)
Dinner tray given to patient

## 2022-10-15 NOTE — ED Provider Notes (Signed)
Emergency Department  Provider Note    Name: Everardo All      Subjective  History of Present Illness    Jasmine [name] is a 37 y.o. female  who presents to the ED today concern for constipation.  Patient says that he has not had a normal bowel movement in 2 weeks.  Patient says that she has had small hard stools and has had some left lower quadrant discomfort.  Patient says that she did ask the nursing staff at her rehab facility if she could have something for constipation and they asked that she be evaluated in the emergency room prior to initiating any medication.  Patient says that does have a history of constipation in the past, just not this bad.  Patient says that last menstrual period was the 20th of this past month.  Denies any associated nausea or vomiting.  Denies any loss of appetite.  Denies any blood in the stool recently.  Denies fevers.  Denies any concern for pregnancy.  Patient does have a physician at her rehab facility that she has access to as needed for follow-up.        Historical Data   Below information reviewed with patient where pertinent:  Past Medical History:   Diagnosis Date    Bipolar disorder, unspecified (CMS [place])     Epileptic seizure (CMS [place])     Tourette's        Current Outpatient Medications   Medication Sig    benztropine (COGENTIN) 1 mg Oral Tablet Take 1 Tablet (1 mg total) by mouth Twice daily    clonazePAM (KLONOPIN) 0.5 mg Oral Tablet Take 1 Tablet (0.5 mg total) by mouth Once a day    cloNIDine HCL (CATAPRES) 0.1 mg Oral Tablet Take 1 Tablet (0.1 mg total) by mouth Three times a day as needed (Patient not taking: Reported on 10/15/2022)    divalproex (DEPAKOTE) 500 mg Oral Tablet, Delayed Release (E.C.) Take 1 Tablet (500 mg total) by mouth Twice daily    divalproex (DEPAKOTE) 500 mg Oral Tablet, Delayed Release (E.C.) Take 2 Tablets (1,000 mg total) by mouth Every night    haloperidol decanoate (HALDOL DECANOATE) 50 mg/mL IntraMUSCULAR Solution Inject 1 mL (50 mg  total) into the muscle Every 28 days    hydrOXYzine pamoate (VISTARIL) 25 mg Oral Capsule Take 1 Capsule (25 mg total) by mouth Three times a day as needed for Itching    hydrOXYzine pamoate (VISTARIL) 50 mg Oral Capsule Take 1 Capsule (50 mg total) by mouth Three times a day as needed for Anxiety (Patient not taking: Reported on 10/15/2022)    nicotine (NICODERM CQ) 21 mg/24 hr Transdermal Patch 24 hr Place 1 Patch (21 mg total) on the skin Once a day (Patient not taking: Reported on 10/15/2022)    OLANZapine (ZYPREXA) 10 mg Oral Tablet Take 1 Tablet (10 mg total) by mouth Twice daily (Patient not taking: Reported on 10/15/2022)    primidone (MYSOLINE) 50 mg Oral Tablet Take 1 Tablet (50 mg total) by mouth Three times a day (Patient not taking: Reported on 10/15/2022)    QUEtiapine (SEROQUEL) 100 mg Oral Tablet Take 5 Tablets (500 mg total) by mouth Every night (Patient not taking: Reported on 10/15/2022)    risperiDONE (RISPERDAL) 2 mg Oral Tablet Take 1 Tablet (2 mg total) by mouth Every morning    risperiDONE (RISPERDAL) 3 mg Oral Tablet Take 1 Tablet (3 mg total) by mouth Every night  Allergies   Allergen Reactions    Darvocet A500 [Propoxyphene N-Acetaminophen]     Latex     Penicillins        No past surgical history on file.    Social History     Tobacco Use    Smoking status: Every Day     Current packs/day: 0.50     Types: Cigarettes    Smokeless tobacco: Never   Vaping Use    Vaping status: Never Used   Substance Use Topics    Alcohol use: Never    Drug use: Not Currently     Types: Methamphetamines              Objective  Physical Exam   Filed Vitals:    10/15/22 1708 10/15/22 1945   BP: (!) 91/55 (!) 150/86   Pulse: 62 78   Resp: 18 18   Temp: 36.7 C (98.1 F)    SpO2: 98% 97%       Physical Exam  Vital signs Reviewed.   Constitutional: NAD.   HENT:   Head: Normocephalic and atraumatic   Mouth/Throat: Oropharynx is clear and moist   Eyes: EOMI, conjunctivae without discharge bilaterally  Neck: Trachea  midline   Cardiovascular: RRR, No murmurs, rubs or gallops   Pulmonary/Chest: BS equal bilaterally, good air movement. No respiratory distress. No wheezes, rales or chest tenderness   Abdominal: Abdomen soft and nondistended.  Patient reports mild discomfort with left lower quadrant to palpation.  No rebound or guarding.  Skin: warm and dry. No rash, erythema, pallor or cyanosis  Psychiatric: normal mood and affect. Behavior is normal   Neurological: Alert&Ox3. Grossly intact         Orders and Results  Patient Data    Labs Ordered/Reviewed   COMPREHENSIVE METABOLIC PANEL, NON-FASTING - Abnormal; Notable for the following components:       Result Value    BUN/CREA RATIO 33 (*)     BILIRUBIN TOTAL 0.1 (*)     All other components within normal limits   CBC WITH DIFF - Abnormal; Notable for the following components:    RBC 4.05 (*)     HGB 11.6 (*)     HCT 34.2 (*)     All other components within normal limits   LIPASE - Normal   LACTIC ACID LEVEL W/ REFLEX FOR LEVEL >2.0 - Normal   HCG QUALITATIVE PREGNANCY, SERUM - Normal   URINALYSIS, MACROSCOPIC - Normal   URINALYSIS, MICROSCOPIC - Normal   CBC/DIFF    Narrative:     The following orders were created for panel order CBC/DIFF.                  Procedure                               Abnormality         Status                                     ---------                               -----------         ------  CBC WITH [id number]                Abnormal            Final result                                                 Please view results for these tests on the individual orders.   URINALYSIS, MACROSCOPIC AND MICROSCOPIC W/CULTURE REFLEX    Narrative:     The following orders were created for panel order URINALYSIS, MACROSCOPIC AND MICROSCOPIC W/CULTURE REFLEX.                  Procedure                               Abnormality         Status                                     ---------                                -----------         ------                                     URINALYSIS, MACROSCOPIC[[id number]]      Normal              Final result                               URINALYSIS, MICROSCOPIC[[id number]]      Normal              Final result                                                 Please view results for these tests on the individual orders.       Imaging:  XR KUB   Final Result by Edi, Radresults In (03/01 1743)   CONSTIPATION.            Radiologist location ID: [place] notes, labs, and imaging reviewed.    Orders:  Orders Placed This Encounter    XR KUB    CBC/DIFF    COMPREHENSIVE METABOLIC PANEL, NON-FASTING    LIPASE    LACTIC ACID LEVEL W/ REFLEX FOR LEVEL >2.0    HCG QUALITATIVE PREGNANCY, SERUM    URINALYSIS, MACROSCOPIC AND MICROSCOPIC W/CULTURE REFLEX    CBC WITH DIFF    URINALYSIS, MACROSCOPIC    URINALYSIS, MICROSCOPIC           Medical Decision Making   Medical Decision Making  In brief, MARIPAT OSANTOWSKI is a 37 y.o. female who presented to the ED for constipation left lower quadrant discomfort.  KUB does reveal constipation  without any evidence of bowel obstruction.  Patient does saying she has a normal in the emergency room and does feel that her left lower quadrant pain has improved.  CBC significant only for mild anemia.  CMP without concerning finding.  Lipase and lactic acid within normal limits.  Pregnancy negative.  Urinalysis within normal limits.  Discussed diagnosis of constipation is likely cause of patient's discomfort.  Will give the patient instructions MiraLax to relieve symptoms of constipation.  Advised to drink plenty water with MiraLax.  Patient may discontinue MiraLax once she has daily soft bowel movements.  Patient should follow-up with her physician had her rehab facility as needed.  Discussed return precautions reasons to return to the ED with any worsening.  Patient is understanding and agreeable with this plan.      Medical Decision  Making  Amount and/or Complexity of Data Reviewed  Labs: ordered.          Clinical Impression:     Clinical Impression   Constipation, unspecified constipation type (Primary)   LLQ abdominal pain         Following the history, physical exam, and ED workup, the patient was deemed stable and suitable for discharge. The patient/caregiver was advised to return to the ED for any new or worsening symptoms. Discharge medications, and follow-up instructions were discussed with the patient/caregiver in detail, who verbalizes understanding. The patient/caregiver is in agreement and is comfortable with the plan of care.    Disposition: Discharged         Current Discharge Medication List        CONTINUE these medications - NO CHANGES were made during your visit.        Details   benztropine 1 mg Tablet  Commonly known as: COGENTIN   1 mg, Oral, 2 TIMES DAILY  Refills: 0     clonazePAM 0.5 mg Tablet  Commonly known as: klonoPIN   0.5 mg, Oral, DAILY  Qty: 15 Tablet  Refills: 0     cloNIDine HCL 0.1 mg Tablet  Commonly known as: CATAPRES   0.1 mg, Oral, 3 TIMES DAILY PRN  Qty: 60 Tablet  Refills: 0     * divalproex 500 mg Tablet, Delayed Release (E.C.)  Commonly known as: DEPAKOTE   1,000 mg, Oral, NIGHTLY  Refills: 0     * divalproex 500 mg Tablet, Delayed Release (E.C.)  Commonly known as: DEPAKOTE   500 mg, Oral, 2 TIMES DAILY  Qty: 60 Tablet  Refills: 0     haloperidol decanoate 50 mg/mL Solution  Commonly known as: HALDOL DECANOATE   50 mg, IntraMUSCULAR, EVERY 28 DAYS  Refills: 0     * hydrOXYzine pamoate 25 mg Capsule  Commonly known as: VISTARIL   25 mg, Oral, 3 TIMES DAILY PRN  Refills: 0     * hydrOXYzine pamoate 50 mg Capsule  Commonly known as: VISTARIL   50 mg, Oral, 3 TIMES DAILY PRN  Qty: 60 Capsule  Refills: 0     nicotine 21 mg/24 hr Patch 24 hr  Commonly known as: NICODERM CQ   21 mg, Transdermal, DAILY  Qty: 30 Patch  Refills: 0     OLANZapine 10 mg Tablet  Commonly known as: zyPREXA   10 mg, Oral, 2 TIMES  DAILY  Qty: 60 Tablet  Refills: 0     primidone 50 mg Tablet  Commonly known as: MYSOLINE   50 mg, Oral, 3 TIMES DAILY  Qty: 90 Tablet  Refills:  0     QUEtiapine 100 mg Tablet  Commonly known as: SEROquel   500 mg, Oral, NIGHTLY  Qty: 150 Tablet  Refills: 0     * risperiDONE 2 mg Tablet  Commonly known as: risperDAL   2 mg, Oral, EVERY MORNING  Refills: 0     * risperiDONE 3 mg Tablet  Commonly known as: risperDAL   3 mg, Oral, NIGHTLY  Refills: 0           * This list has 6 medication(s) that are the same as other medications prescribed for you. Read the directions carefully, and ask your doctor or other care provider to review them with you.                Follow up:   Aldean Jewett, MD  [place] [address]  [phone]    Go in 3 days  As needed    [facility] - Emergency Department  [address]  [place] [id number]  [phone]    As needed        Disposition: Discharged    Follow up:   Aldean Jewett, MD  [place] [address]  [phone]    Go in 3 days  As needed    [facility] - Emergency Department  [address]  [place] [id number]  [phone]    As needed               History reviewed in chart.  Parts of this patient's chart were completed in a retrospective fashion due to simultaneous direct patient care activities in the Emergency Department.

## 2022-10-15 NOTE — ED Nurses Note (Signed)
Patient discharged home with family.  AVS reviewed with patient/care giver.  A written copy of the AVS and discharge instructions was given to the patient/care giver.  Questions sufficiently answered as needed.  Patient/care giver encouraged to follow up with PCP as indicated.  In the event of an emergency, patient/care giver instructed to call 911 or go to the nearest emergency room.

## 2022-10-15 NOTE — Discharge Instructions (Signed)
Take 3 capfuls of MiraLax tomorrow morning.  If no bowel movement by evening, take an additional 2 capfuls.  Take 2 capfuls MiraLax the following day and then 1 capful daily following this until daily soft bowel movements are achieved, then may discontinue.  Plenty of water when using MiraLax.

## 2022-10-15 NOTE — ED Nurses Note (Signed)
Called [phone] to speak with RJ to pick up the patient. He stated he would send staff immediately to pick her up.

## 2022-10-26 ENCOUNTER — Other Ambulatory Visit: Payer: Self-pay

## 2022-10-26 ENCOUNTER — Other Ambulatory Visit: Payer: 59 | Attending: PSYCHIATRY AND NEUROLOGY-NEUROLOGY

## 2022-10-26 DIAGNOSIS — F1120 Opioid dependence, uncomplicated: Secondary | ICD-10-CM | POA: Insufficient documentation

## 2022-10-26 DIAGNOSIS — F1220 Cannabis dependence, uncomplicated: Secondary | ICD-10-CM

## 2022-10-26 DIAGNOSIS — F1520 Other stimulant dependence, uncomplicated: Secondary | ICD-10-CM | POA: Insufficient documentation

## 2022-10-26 DIAGNOSIS — F3164 Bipolar disorder, current episode mixed, severe, with psychotic features: Secondary | ICD-10-CM

## 2022-10-26 LAB — COMPREHENSIVE METABOLIC PNL, FASTING
ALBUMIN/GLOBULIN RATIO: 1.5 (ref 1.5–2.5)
ALBUMIN: 4 g/dL (ref 3.5–5.0)
ALKALINE PHOSPHATASE: 53 U/L (ref 38–126)
ALT (SGPT): 15 U/L (ref <35)
ANION GAP: 10 mmol/L (ref 5–19)
AST (SGOT): 20 U/L (ref 14–36)
BILIRUBIN TOTAL: 0.2 mg/dL (ref 0.2–1.3)
BUN/CREA RATIO: 32 — ABNORMAL HIGH (ref 6–20)
BUN: 33 mg/dL — ABNORMAL HIGH (ref 7–17)
CALCIUM: 9.4 mg/dL (ref 8.4–10.2)
CHLORIDE: 106 mmol/L (ref 98–107)
CO2 TOTAL: 22 mmol/L (ref 22–30)
CREATININE: 1.04 mg/dL — ABNORMAL HIGH (ref 0.52–1.00)
ESTIMATED GFR: >60 mL/min/{1.73_m2} (ref >60)
GLUCOSE: 84 mg/dL (ref 74–106)
POTASSIUM: 4.4 mmol/L (ref 3.5–5.1)
PROTEIN TOTAL: 6.6 g/dL (ref 6.3–8.2)
SODIUM: 138 mmol/L (ref 137–145)

## 2022-10-26 LAB — THYROXINE, TOTAL T4: T4 TOTAL: 4.9 ug/dL — ABNORMAL LOW (ref 5.5–11.0)

## 2022-10-26 LAB — CBC WITH DIFF
BASOPHIL #: 0 10*3/uL (ref 0.00–0.20)
BASOPHIL %: 0 % (ref 0–2)
EOSINOPHIL #: 0.1 10*3/uL (ref 0.00–0.60)
EOSINOPHIL %: 2 % (ref 0–5)
HCT: 34.6 % — ABNORMAL LOW (ref 36.0–48.0)
HGB: 11.7 g/dL (ref 11.6–14.8)
LYMPHOCYTE #: 1.2 10*3/uL (ref 1.10–3.80)
LYMPHOCYTE %: 27 % (ref 19–46)
MCH: 28.6 pg (ref 24.4–34.0)
MCHC: 33.7 g/dL (ref 30.0–37.0)
MCV: 84.7 fL (ref 80.0–100.0)
MONOCYTE #: 0.5 10*3/uL (ref 0.10–0.80)
MONOCYTE %: 10 % (ref 4–12)
MPV: 8.9 fL (ref 7.5–11.5)
NEUTROPHIL #: 2.7 10*3/uL (ref 1.80–7.50)
NEUTROPHIL %: 60 % (ref 41–69)
PLATELETS: 150 10*3/uL (ref 130–400)
RBC: 4.09 10*6/uL (ref 3.50–5.50)
RDW: 15.4 % — ABNORMAL HIGH (ref 11.5–14.0)
WBC: 4.5 10*3/uL (ref 4.5–11.5)

## 2022-10-26 LAB — LIPID PANEL
CHOLESTEROL: 161 mg/dL (ref 0–200)
HDL CHOL: 68 mg/dL (ref >40)
LDL CALC: 72 mg/dL (ref <100)
TRIGLYCERIDES: 106 mg/dL (ref <150)

## 2022-10-26 LAB — HGA1C (HEMOGLOBIN A1C WITH EST AVG GLUCOSE)
ESTIMATED AVERAGE GLUCOSE: 97 mg/dL
HEMOGLOBIN A1C: 5 % (ref 4.0–6.0)

## 2022-10-26 LAB — HEPATITIS C ANTIBODY SCREEN WITH REFLEX TO HCV PCR: HCV ANTIBODY QUALITATIVE: REACTIVE — AB

## 2022-10-26 LAB — HCG QUALITATIVE PREGNANCY, SERUM: PREGNANCY, SERUM QUALITATIVE: NEGATIVE

## 2022-10-26 LAB — HIV1/HIV2 SCREEN, COMBINED ANTIGEN AND ANTIBODY: HIV SCREEN, COMBINED ANTIGEN & ANTIBODY: NEGATIVE

## 2022-10-26 LAB — HEPATITIS B SURFACE ANTIGEN: HBV SURFACE ANTIGEN QUALITATIVE: NEGATIVE

## 2022-10-26 LAB — T3 (TRIIODOTHYRONINE), FREE, SERUM: T3 FREE: 3.5 pg/mL (ref 2.8–5.2)

## 2022-10-26 LAB — THYROID STIMULATING HORMONE (SENSITIVE TSH): TSH: 6.9 u[IU]/mL — ABNORMAL HIGH (ref 0.465–4.680)

## 2022-10-26 LAB — HEPATITIS A (HAV) IGM ANTIBODY: HAV IGM: NEGATIVE

## 2022-10-26 LAB — HEPATITIS B CORE IGM, AB: HBV CORE IGM ANTIBODY QUALITATIVE: NEGATIVE

## 2022-10-27 LAB — HEPATITIS C VIRUS (HCV) RNA DETECTION AND QUANTIFICATION, PCR, PLASMA: HCV QUANTITATIVE PCR: NOT DETECTED

## 2022-10-28 ENCOUNTER — Emergency Department (HOSPITAL_COMMUNITY)
Admission: RE | Admit: 2022-10-28 | Discharge: 2022-10-28 | Disposition: A | Discharge status: Home / Self Care | Payer: 59 | Source: Ambulatory Visit

## 2022-10-28 ENCOUNTER — Emergency Department
Admission: EM | Admit: 2022-10-28 | Discharge: 2022-10-28 | Disposition: A | Discharge status: Home / Self Care | Payer: 59 | Source: Home / Self Care | Attending: Emergency Medicine | Admitting: Emergency Medicine

## 2022-10-28 ENCOUNTER — Other Ambulatory Visit: Payer: Self-pay

## 2022-10-28 DIAGNOSIS — K5900 Constipation, unspecified: Secondary | ICD-10-CM | POA: Insufficient documentation

## 2022-10-28 DIAGNOSIS — Z5329 Procedure and treatment not carried out because of patient's decision for other reasons: Secondary | ICD-10-CM | POA: Insufficient documentation

## 2022-10-28 DIAGNOSIS — F17210 Nicotine dependence, cigarettes, uncomplicated: Secondary | ICD-10-CM

## 2022-10-28 MED ORDER — PEG 3350-ELECTROLYTES 236 GRAM-22.74 GRAM-6.74 GRAM-5.86 GRAM SOLUTION
4.0000 L | Freq: Once | ORAL | 0 refills | Status: AC
Start: 2022-10-28 — End: 2022-10-28

## 2022-10-28 NOTE — ED Triage Notes (Signed)
Pt reports to the ED complaining of abdominal pain. Pt reports being seen at [facility] for constipation without resolution.

## 2022-10-28 NOTE — ED Provider Notes (Signed)
[facility]  Emergency Department      Name: Jasmine Jasmine [name]  Age and Gender: 37 y.o. female  Date of Birth: 07-29-1986  Date of Service: 10/28/2022   MRN: [id number]  PCP: No Pcp    Chief Complaint   Patient presents with    Abdominal Pain       HPI:    Jasmine Jasmine [name] is a 37 y.o. female presenting with abdominal pain.    For about x1 week the patient reports that she has been having abdominal pain. Per triage note the patient was seen at [facility] for constipation without resolution. She reports that she has been taking Miralax daily, however she has not had a bowel movement since 10/23/22. Patient denies black/bloody stool, cough, congestion, chest pain, palpations, shortness of breath, fever, chills, diarrhea, nausea/vomiting, or any other worrisome symptom.      ROS:  Jasmine [name] systems reviewed and are negative, unless stated in the HPI.      Below pertinent information reviewed with patient and/or EMR:  Past Medical History:   Diagnosis Date    Bipolar disorder, unspecified (CMS [place])     Epileptic seizure (CMS [place])     Tourette's      Medications Prior to Admission       Prescriptions    benztropine (COGENTIN) 1 mg Oral Tablet    Take 1 Tablet (1 mg total) by mouth Twice daily    clonazePAM (KLONOPIN) 0.5 mg Oral Tablet    Take 1 Tablet (0.5 mg total) by mouth Once a day    cloNIDine HCL (CATAPRES) 0.1 mg Oral Tablet    Take 1 Tablet (0.1 mg total) by mouth Three times a day as needed    Patient not taking:  Reported on 10/15/2022    divalproex (DEPAKOTE) 500 mg Oral Tablet, Delayed Release (E.C.)    Take 1 Tablet (500 mg total) by mouth Twice daily    divalproex (DEPAKOTE) 500 mg Oral Tablet, Delayed Release (E.C.)    Take 2 Tablets (1,000 mg total) by mouth Every night    haloperidol decanoate (HALDOL DECANOATE) 50 mg/mL IntraMUSCULAR Solution    Inject 1 mL (50 mg total) into the muscle Every 28 days    hydrOXYzine pamoate (VISTARIL) 25 mg Oral Capsule    Take 1 Capsule (25 mg total) by mouth Three  times a day as needed for Itching    hydrOXYzine pamoate (VISTARIL) 50 mg Oral Capsule    Take 1 Capsule (50 mg total) by mouth Three times a day as needed for Anxiety    Patient not taking:  Reported on 10/15/2022    nicotine (NICODERM CQ) 21 mg/24 hr Transdermal Patch 24 hr    Place 1 Patch (21 mg total) on the skin Once a day    Patient not taking:  Reported on 10/15/2022    OLANZapine (ZYPREXA) 10 mg Oral Tablet    Take 1 Tablet (10 mg total) by mouth Twice daily    Patient not taking:  Reported on 10/15/2022    primidone (MYSOLINE) 50 mg Oral Tablet    Take 1 Tablet (50 mg total) by mouth Three times a day    Patient not taking:  Reported on 10/15/2022    QUEtiapine (SEROQUEL) 100 mg Oral Tablet    Take 5 Tablets (500 mg total) by mouth Every night    Patient not taking:  Reported on 10/15/2022    risperiDONE (RISPERDAL) 2 mg Oral Tablet    Take 1  Tablet (2 mg total) by mouth Every morning    risperiDONE (RISPERDAL) 3 mg Oral Tablet    Take 1 Tablet (3 mg total) by mouth Every night          Allergies   Allergen Reactions    Darvocet A500 [Propoxyphene N-Acetaminophen]     Latex     Penicillins      No past surgical history on file.  Family Medical History:    None       Social History     Tobacco Use    Smoking status: Every Day     Current packs/day: 0.50     Types: Cigarettes    Smokeless tobacco: Never   Vaping Use    Vaping status: Never Used   Substance Use Topics    Alcohol use: Never    Drug use: Not Currently     Types: Methamphetamines       Objective:  ED Triage Vitals [10/28/22 1613]   BP (Non-Invasive) 104/64   Heart Rate 77   Respiratory Rate 18   Temperature 36.6 C (97.9 F)   SpO2 98 %   Weight 70.3 kg (155 lb)   Height 1.651 m (5\' 5" )     Nursing notes and vital signs reviewed.    Constitutional:  No acute distress.  Alert and oriented to person, place, and time.   HENT:   Head: Normocephalic and atraumatic.   Mouth/Throat: Oropharynx is clear and moist.   Eyes: EOMI, PERRL   Neck: Trachea midline. Neck  supple.  Cardiovascular: RRR, No murmurs, rubs or gallops. Intact distal pulses and no edema.  Pulmonary/Chest: BS equal bilaterally. No respiratory distress. No wheezes, rales or chest tenderness.   Abdominal: BS +. Abdomen soft, no tenderness, rebound or guarding.  Back: No midline spinal tenderness, no paraspinal tenderness, no CVA tenderness.           Musculoskeletal: No tenderness or deformity.  Skin: warm and dry. No rash, erythema, pallor or cyanosis  Psychiatric: normal mood and affect. Behavior is normal.   Neurological: Patient keenly alert and responsive, CN II-XII grossly intact, moving Jasmine [name] extremities equally and fully, normal gait    Labs:   Labs Ordered/Reviewed - No data to display    Imaging:  No orders to display         MDM/Course:  Medical Decision Making  KUB was consistent with constipation.  I attempted to prescribe patient GoLYTELY be show eloped prior to discharge.    Amount and/or Complexity of Data Reviewed  Radiology: ordered and independent interpretation performed. Decision-making details documented in ED Course.    Risk  Prescription drug management.             Clinical Impression:     Clinical Impression   Constipation, unspecified constipation type (Primary)       Medications given:           Disposition: Eloped       Current Discharge Medication List        START taking these medications.        Details   PEG 3350-Electrolytes 236-22.74-6.74 -5.86 gram Recon Soln   4,000 mL, Oral, ONCE  Qty: 4000 mL  Refills: 0            CONTINUE these medications - NO CHANGES were made during your visit.        Details   benztropine 1 mg Tablet  Commonly known as: COGENTIN   1 mg,  Oral, 2 TIMES DAILY  Refills: 0     clonazePAM 0.5 mg Tablet  Commonly known as: klonoPIN   0.5 mg, Oral, DAILY  Qty: 15 Tablet  Refills: 0     cloNIDine HCL 0.1 mg Tablet  Commonly known as: CATAPRES   0.1 mg, Oral, 3 TIMES DAILY PRN  Qty: 60 Tablet  Refills: 0     * divalproex 500 mg Tablet, Delayed Release  (E.C.)  Commonly known as: DEPAKOTE   1,000 mg, Oral, NIGHTLY  Refills: 0     * divalproex 500 mg Tablet, Delayed Release (E.C.)  Commonly known as: DEPAKOTE   500 mg, Oral, 2 TIMES DAILY  Qty: 60 Tablet  Refills: 0     haloperidol decanoate 50 mg/mL Solution  Commonly known as: HALDOL DECANOATE   50 mg, IntraMUSCULAR, EVERY 28 DAYS  Refills: 0     * hydrOXYzine pamoate 25 mg Capsule  Commonly known as: VISTARIL   25 mg, Oral, 3 TIMES DAILY PRN  Refills: 0     * hydrOXYzine pamoate 50 mg Capsule  Commonly known as: VISTARIL   50 mg, Oral, 3 TIMES DAILY PRN  Qty: 60 Capsule  Refills: 0     nicotine 21 mg/24 hr Patch 24 hr  Commonly known as: NICODERM CQ   21 mg, Transdermal, DAILY  Qty: 30 Patch  Refills: 0     OLANZapine 10 mg Tablet  Commonly known as: zyPREXA   10 mg, Oral, 2 TIMES DAILY  Qty: 60 Tablet  Refills: 0     primidone 50 mg Tablet  Commonly known as: MYSOLINE   50 mg, Oral, 3 TIMES DAILY  Qty: 90 Tablet  Refills: 0     QUEtiapine 100 mg Tablet  Commonly known as: SEROquel   500 mg, Oral, NIGHTLY  Qty: 150 Tablet  Refills: 0     * risperiDONE 2 mg Tablet  Commonly known as: risperDAL   2 mg, Oral, EVERY MORNING  Refills: 0     * risperiDONE 3 mg Tablet  Commonly known as: risperDAL   3 mg, Oral, NIGHTLY  Refills: 0           * This list has 6 medication(s) that are the same as other medications prescribed for you. Read the directions carefully, and ask your doctor or other care provider to review them with you.                  Follow up:    Primary Care Physician [place]  [phone]          Parts of this patients chart were completed in a retrospective fashion due to simultaneous direct patient care activities in the Emergency Department.   This note was partially generated using MModal Fluency Direct system, and there may be some incorrect words, spellings, and punctuation that were not noted in checking the note before saving.      I am scribing for, and in the presence of, Dr. Sallee Lange for  services provided on 10/28/2022.  Rosana Hoes, SCRIBE 10/28/2022, 17:37    I personally performed the services described in this documentation, as scribed  in my presence, and it is both accurate  and complete.    Lysle Morales, MD

## 2022-10-28 NOTE — ED Nurses Note (Signed)
Attempted to provide discharge instructions to patient, but unable to find patient in vertical care area.  Also unable to find patient in waiting room or elsewhere on unit.
